# Patient Record
Sex: Male | Born: 1985 | Race: Black or African American | Hispanic: No | Marital: Single | Smoking: Never smoker
Health system: Southern US, Community
[De-identification: ages and names within clinical notes are randomized; demographics above are authoritative.]

## PROBLEM LIST (undated history)

## (undated) DIAGNOSIS — E119 Type 2 diabetes mellitus without complications: Secondary | ICD-10-CM

## (undated) DIAGNOSIS — K219 Gastro-esophageal reflux disease without esophagitis: Secondary | ICD-10-CM

## (undated) DIAGNOSIS — I10 Essential (primary) hypertension: Secondary | ICD-10-CM

## (undated) HISTORY — DX: Essential (primary) hypertension: I10

## (undated) HISTORY — DX: Type 2 diabetes mellitus without complications: E11.9

---

## 2013-07-23 ENCOUNTER — Encounter: Payer: Self-pay | Admitting: Sports Medicine

## 2013-07-23 ENCOUNTER — Ambulatory Visit (INDEPENDENT_AMBULATORY_CARE_PROVIDER_SITE_OTHER): Payer: BC Managed Care – PPO | Admitting: Sports Medicine

## 2013-07-23 VITALS — BP 182/94 | HR 82 | Wt 305.0 lb

## 2013-07-23 DIAGNOSIS — Z0000 Encounter for general adult medical examination without abnormal findings: Secondary | ICD-10-CM | POA: Insufficient documentation

## 2013-07-23 DIAGNOSIS — E669 Obesity, unspecified: Secondary | ICD-10-CM

## 2013-07-23 DIAGNOSIS — I10 Essential (primary) hypertension: Secondary | ICD-10-CM

## 2013-07-23 DIAGNOSIS — E119 Type 2 diabetes mellitus without complications: Secondary | ICD-10-CM

## 2013-07-23 DIAGNOSIS — Z299 Encounter for prophylactic measures, unspecified: Secondary | ICD-10-CM

## 2013-07-23 LAB — COMPREHENSIVE METABOLIC PANEL WITH GFR
ALT: 21 U/L (ref 0–53)
Alkaline Phosphatase: 94 U/L (ref 39–117)
Total Bilirubin: 0.5 mg/dL (ref 0.3–1.2)

## 2013-07-23 LAB — LIPID PANEL
Cholesterol: 123 mg/dL (ref 0–200)
HDL: 35 mg/dL — ABNORMAL LOW (ref >39)
LDL Cholesterol: 64 mg/dL (ref 0–99)
Total CHOL/HDL Ratio: 3.5 Ratio
Triglycerides: 121 mg/dL (ref <150)
VLDL: 24 mg/dL (ref 0–40)

## 2013-07-23 LAB — COMPREHENSIVE METABOLIC PANEL
AST: 17 U/L (ref 0–37)
Albumin: 4.5 g/dL (ref 3.5–5.2)
BUN: 12 mg/dL (ref 6–23)
CO2: 25 mEq/L (ref 19–32)
Calcium: 9.6 mg/dL (ref 8.4–10.5)
Chloride: 99 mEq/L (ref 96–112)
Creat: 1.14 mg/dL (ref 0.50–1.35)
Glucose, Bld: 107 mg/dL — ABNORMAL HIGH (ref 70–99)
Potassium: 4.1 mEq/L (ref 3.5–5.3)
Sodium: 136 mEq/L (ref 135–145)
Total Protein: 7.7 g/dL (ref 6.0–8.3)

## 2013-07-23 LAB — HEMOGLOBIN A1C
Hgb A1c MFr Bld: 6.6 % — ABNORMAL HIGH (ref <5.7)
Mean Plasma Glucose: 143 mg/dL — ABNORMAL HIGH (ref <117)

## 2013-07-23 LAB — CBC
HCT: 46.5 % (ref 39.0–52.0)
Hemoglobin: 16.2 g/dL (ref 13.0–17.0)
MCH: 29.3 pg (ref 26.0–34.0)
MCHC: 34.8 g/dL (ref 30.0–36.0)
MCV: 84.1 fL (ref 78.0–100.0)
Platelets: 282 K/uL (ref 150–400)
RBC: 5.53 MIL/uL (ref 4.22–5.81)
RDW: 14.1 % (ref 11.5–15.5)
WBC: 7.6 10*3/uL (ref 4.0–10.5)

## 2013-07-23 LAB — TSH: TSH: 1.747 u[IU]/mL (ref 0.350–4.500)

## 2013-07-23 MED ORDER — LISINOPRIL-HYDROCHLOROTHIAZIDE 20-25 MG PO TABS: 1.0000 | ORAL_TABLET | Freq: Every day | ORAL | Status: DC

## 2013-07-23 MED ORDER — PHENTERMINE HCL 37.5 MG PO CAPS: 37.5000 mg | ORAL_CAPSULE | ORAL | Status: DC

## 2013-07-23 NOTE — Assessment & Plan Note (Signed)
Up-to-date on all preventative measures, has had flu shot and tetanus shot earlier this year.

## 2013-07-23 NOTE — Assessment & Plan Note (Signed)
Start lisinopril/hydrochlorothiazide. Routine blood work. We will also work on weight loss, and risk factor modification.

## 2013-07-23 NOTE — Progress Notes (Signed)
  Subjective:    CC: Establish care.   HPI:  This is a very pleasant 27 year old male, he has a history of elevated blood pressure, and obesity. He comes to establish care, and manage his risk factors.  Hypertension: No headaches, visual changes, chest pain, has been on medication in the past he is unable to remember the name of.  Obesity: Has tried dieting, exercise, eager to try weight loss medication.  Preventative measures: Up-to-date on flu and tetanus vaccinations.  Past medical history, Surgical history, Family history not pertinant except as noted below, Social history, Allergies, and medications have been entered into the medical record, reviewed, and no changes needed.   Review of Systems: No headache, visual changes, nausea, vomiting, diarrhea, constipation, dizziness, abdominal pain, skin rash, fevers, chills, night sweats, swollen lymph nodes, weight loss, chest pain, body aches, joint swelling, muscle aches, shortness of breath, mood changes, visual or auditory hallucinations.  Objective:    General: Well Developed, well nourished, and in no acute distress.  Neuro: Alert and oriented x3, extra-ocular muscles intact, sensation grossly intact.  HEENT: Normocephalic, atraumatic, pupils equal round reactive to light, neck supple, no masses, no lymphadenopathy, thyroid nonpalpable.  Skin: Warm and dry, no rashes noted.  Cardiac: Regular rate and rhythm, no murmurs rubs or gallops.  Respiratory: Clear to auscultation bilaterally. Not using accessory muscles, speaking in full sentences.  Abdominal: Soft, nontender, nondistended, positive bowel sounds, no masses, no organomegaly.  Musculoskeletal: Shoulder, elbow, wrist, hip, knee, ankle stable, and with full range of motion. Impression and Recommendations:    The patient was counselled, risk factors were discussed, anticipatory guidance given.

## 2013-07-23 NOTE — Assessment & Plan Note (Signed)
Phentermine, return monthly for weight checks and refills. Referral to nutrition.

## 2013-07-24 DIAGNOSIS — E119 Type 2 diabetes mellitus without complications: Secondary | ICD-10-CM | POA: Insufficient documentation

## 2013-07-24 DIAGNOSIS — E1169 Type 2 diabetes mellitus with other specified complication: Secondary | ICD-10-CM | POA: Insufficient documentation

## 2013-08-28 ENCOUNTER — Ambulatory Visit: Payer: BC Managed Care – PPO | Admitting: Sports Medicine

## 2013-09-04 ENCOUNTER — Ambulatory Visit: Payer: BC Managed Care – PPO | Admitting: Sports Medicine

## 2013-09-04 DIAGNOSIS — Z0289 Encounter for other administrative examinations: Secondary | ICD-10-CM

## 2013-10-23 ENCOUNTER — Ambulatory Visit: Payer: BC Managed Care – PPO | Admitting: Sports Medicine

## 2013-10-23 DIAGNOSIS — Z0289 Encounter for other administrative examinations: Secondary | ICD-10-CM

## 2014-02-16 ENCOUNTER — Telehealth: Payer: Self-pay

## 2014-02-16 NOTE — Telephone Encounter (Signed)
Left message for patient to call back to schedule a follow up for HTN.

## 2014-02-25 ENCOUNTER — Emergency Department (INDEPENDENT_AMBULATORY_CARE_PROVIDER_SITE_OTHER)
Admission: EM | Admit: 2014-02-25 | Discharge: 2014-02-25 | Disposition: A | Discharge status: Home / Self Care | Payer: Worker's Compensation | Source: Home / Self Care | Attending: Emergency Medicine | Admitting: Emergency Medicine

## 2014-02-25 ENCOUNTER — Encounter: Payer: Self-pay | Admitting: Emergency Medicine

## 2014-02-25 ENCOUNTER — Emergency Department (INDEPENDENT_AMBULATORY_CARE_PROVIDER_SITE_OTHER): Payer: Worker's Compensation

## 2014-02-25 DIAGNOSIS — I10 Essential (primary) hypertension: Secondary | ICD-10-CM

## 2014-02-25 DIAGNOSIS — S335XXA Sprain of ligaments of lumbar spine, initial encounter: Secondary | ICD-10-CM

## 2014-02-25 DIAGNOSIS — S39012A Strain of muscle, fascia and tendon of lower back, initial encounter: Secondary | ICD-10-CM

## 2014-02-25 DIAGNOSIS — M549 Dorsalgia, unspecified: Secondary | ICD-10-CM

## 2014-02-25 MED ORDER — CYCLOBENZAPRINE HCL 10 MG PO TABS: ORAL_TABLET | ORAL | Status: DC

## 2014-02-25 MED ORDER — LISINOPRIL-HYDROCHLOROTHIAZIDE 20-25 MG PO TABS: 1.0000 | ORAL_TABLET | Freq: Every day | ORAL | Status: DC

## 2014-02-25 MED ORDER — HYDROCODONE-ACETAMINOPHEN 5-325 MG PO TABS: 1.0000 | ORAL_TABLET | ORAL | Status: DC | PRN

## 2014-02-25 MED ORDER — IBUPROFEN 600 MG PO TABS: 600.0000 mg | ORAL_TABLET | Freq: Three times a day (TID) | ORAL | Status: DC | PRN

## 2014-02-25 NOTE — ED Notes (Signed)
Reports twisting at work to catch falling cart and pulling muscles along mid and lower right side of back.

## 2014-02-25 NOTE — ED Provider Notes (Signed)
CSN: [id number]     Arrival date & time 02/25/14  1724 History   First MD Initiated Contact with Patient 02/25/14 1726     Chief Complaint  Patient presents with  . Back Pain   Reports twisting at work to catch falling cart and pulling muscles along mid and lower right side of back.  At 9 am today.   Patient is a 28 y.o. male presenting with back pain. The history is provided by the patient.  Back Pain Location:  Lumbar spine (Right lumbosacral) Quality: Sharp. Radiates to:  Does not radiate Pain severity:  Severe (9/10) Onset quality:  Sudden Duration:  9 hours Timing:  Constant Progression:  Worsening Chronicity:  New Context: lifting heavy objects, occupational injury and twisting   Relieved by:  Nothing  Denies prior back problems.  Associated symptoms: No radiation, no leg pain. No incontinence or urinary symptoms.  Past Medical History  Diagnosis Date  . Hypertension    History reviewed. No pertinent past surgical history. Family History  Problem Relation Age of Onset  . Hyperlipidemia Mother   . Hypertension Mother    History  Substance Use Topics  . Smoking status: Never Smoker   . Smokeless tobacco: Not on file  . Alcohol Use: No    Review of Systems  Musculoskeletal: Positive for back pain.  All other systems reviewed and are negative.   Allergies  Review of patient's allergies indicates no known allergies.  Home Medications   Prior to Admission medications   Medication Sig Start Date End Date Taking? Authorizing Provider  cyclobenzaprine (FLEXERIL) 10 MG tablet One-Half tablet every 8 hours as needed for muscle relaxant. May take a whole tablet at bedtime.-- Caution: May cause drowsiness. 02/25/14   Lajean Manesavid Massey, MD  HYDROcodone-acetaminophen (NORCO/VICODIN) 5-325 MG per tablet Take 1-2 tablets by mouth every 4 (four) hours as needed for severe pain. Take with food. 02/25/14   Lajean Manesavid Massey, MD  ibuprofen (ADVIL,MOTRIN) 600 MG tablet Take 1 tablet  (600 mg total) by mouth every 8 (eight) hours as needed for mild pain or moderate pain. Take with food. 02/25/14   Lajean Manesavid Massey, MD  lisinopril-hydrochlorothiazide (PRINZIDE,ZESTORETIC) 20-25 MG per tablet Take 1 tablet by mouth daily. 07/23/13   Monica Bectonhomas J Thekkekandam, MD  lisinopril-hydrochlorothiazide (PRINZIDE,ZESTORETIC) 20-25 MG per tablet Take 1 tablet by mouth daily. 02/25/14   Lajean Manesavid Massey, MD   BP 164/97  Pulse 76  Temp(Src) 98.3 F (36.8 C) (Oral)  Resp 16  Ht 5\' 6"  (1.676 m)  Wt 294 lb (133.358 kg)  BMI 47.48 kg/m2  SpO2 97% Physical Exam  Nursing note and vitals reviewed. Constitutional: He is oriented to person, place, and time. He appears well-developed and well-nourished. He is cooperative.  Non-toxic appearance. He appears distressed (Appears uncomfortable from low back pain.).  HENT:  Head: Normocephalic and atraumatic.  Mouth/Throat: Oropharynx is clear and moist.  Eyes: EOM are normal. Pupils are equal, round, and reactive to light. No scleral icterus.  Neck: Neck supple.  Cardiovascular: Regular rhythm and normal heart sounds.   Pulmonary/Chest: Effort normal and breath sounds normal. No respiratory distress. He has no wheezes. He has no rales. He exhibits no tenderness.  Abdominal: Soft. There is no tenderness.  Musculoskeletal:       Right hip: Normal.       Left hip: Normal.       Cervical back: He exhibits no tenderness.       Thoracic back: He exhibits no tenderness.  Lumbar back: He exhibits decreased range of motion, tenderness, bony tenderness and spasm. He exhibits no swelling, no edema, no deformity, no laceration and normal pulse.  Negative Right straight leg-raise test. Negative Left straight leg-raise test.  Negative Right Luisa Hart test. Negative Left Luisa Hart test.  Lumbar area: Very tender LS-spine and right paralumbar muscles with muscle spasm. Pain exacerbated by torsion flexion and extension.    Neurological: He is alert and oriented to  person, place, and time. He has normal strength. He displays no atrophy, no tremor and normal reflexes. No cranial nerve deficit or sensory deficit. He exhibits normal muscle tone. Gait normal.  Reflex Scores:      Patellar reflexes are 2+ on the right side and 2+ on the left side.      Achilles reflexes are 2+ on the right side and 2+ on the left side. Skin: Skin is warm, dry and intact. No lesion and no rash noted.  Psychiatric: He has a normal mood and affect.    ED Course  Procedures (including critical care time) Labs Review Labs Reviewed - No data to display  Imaging Review Dg Lumbar Spine Complete  02/25/2014   CLINICAL DATA:  Back pain after lifting injury today.  EXAM: LUMBAR SPINE - COMPLETE 4+ VIEW  COMPARISON:  None.  FINDINGS: There are 5 lumbar type vertebral bodies. The alignment is normal. The disc spaces are preserved. There is no evidence of fracture, pars defect or significant facet disease.  IMPRESSION: Negative lumbar spine radiographs.   Electronically Signed   By: Roxy Horseman M.D.   On: 02/25/2014 19:00     MDM   1. Lumbar strain, initial encounter    reviewed with patient that x-ray LS-spine shows no fracture, no acute abnormalities.  Discussed treatment with patient. Ice x24 hours, then heat. Ibuprofen 600 mg every 8 hours with food when necessary mild to moderate pain. Vicodin 5/325. #12 No refills. One or 2 by mouth every 4-6 hours when necessary severe pain, but precautions discussed. Flexeril 10 mg. One half 3 times a day when necessary muscle relaxant but drowsiness precautions discussed.  I completed the "[employer] medical report" form supplied by his employer. Advised no lifting from 7/23-7/28/15. He may return to restricted duty on 02/26/2014. He may work, but absolutely no lifting, bending, pushing/pulling, operating machinery or motor vehicle from 7/23-7/28/15. Please see the [employer] medical report that I completed for other details . Advise followup  appointment at employer health services for recheck on Tuesday 03/02/2014.  Precautions discussed. Red flags discussed. Questions invited and answered. Patient voiced understanding and agreement.   Addendum. He has hypertension, BP today 164/97. He ran out of his BP meds.--- I refilled 30 days of his lisinopril/HCTZ BP med, and urged him to followup with his PCP within 30 days and explained the importance of this and risks of not following up.--He voiced understanding and agreement.  Lajean Manes, MD 02/25/14 2051

## 2014-05-12 ENCOUNTER — Ambulatory Visit (INDEPENDENT_AMBULATORY_CARE_PROVIDER_SITE_OTHER): Payer: Self-pay | Admitting: Sports Medicine

## 2014-05-12 ENCOUNTER — Encounter: Payer: Self-pay | Admitting: Sports Medicine

## 2014-05-12 VITALS — BP 187/83 | HR 82 | Ht 65.0 in | Wt 310.0 lb

## 2014-05-12 DIAGNOSIS — B351 Tinea unguium: Secondary | ICD-10-CM

## 2014-05-12 DIAGNOSIS — I10 Essential (primary) hypertension: Secondary | ICD-10-CM

## 2014-05-12 MED ORDER — OLMESARTAN-AMLODIPINE-HCTZ 40-10-25 MG PO TABS: 1.0000 | ORAL_TABLET | Freq: Every day | ORAL | Status: DC

## 2014-05-12 MED ORDER — TERBINAFINE HCL 250 MG PO TABS: 250.0000 mg | ORAL_TABLET | Freq: Every day | ORAL | Status: DC

## 2014-05-12 NOTE — Assessment & Plan Note (Signed)
Lamisil for 3-6 months. 

## 2014-05-12 NOTE — Assessment & Plan Note (Signed)
Insufficient control with lisinopril/hydrochlorothiazide. Switching to Tribenzor. Return in 2 weeks.

## 2014-05-12 NOTE — Progress Notes (Signed)
  Subjective:    CC: Followup  HPI: Hypertension: Richard Mooney has been somewhat delinquent, I haven't seen him for months, blood pressure is very elevated. No headaches, visual changes, chest pain, shortness of breath. He went to urgent care and his medications refilled.  Diabetes mellitus type 2: Essentially diet-controlled.   Toenail fungus: Would like this treated.  Past medical history, Surgical history, Family history not pertinant except as noted below, Social history, Allergies, and medications have been entered into the medical record, reviewed, and no changes needed.   Review of Systems: No fevers, chills, night sweats, weight loss, chest pain, or shortness of breath.   Objective:    General: Well Developed, well nourished, and in no acute distress.  Neuro: Alert and oriented x3, extra-ocular muscles intact, sensation grossly intact.  HEENT: Normocephalic, atraumatic, pupils equal round reactive to light, neck supple, no masses, no lymphadenopathy, thyroid nonpalpable.  Skin: Warm and dry, no rashes. Cardiac: Regular rate and rhythm, no murmurs rubs or gallops, 1+ lower extremity edema.  Respiratory: Clear to auscultation bilaterally. Not using accessory muscles, speaking in full sentences.  Diabetic Foot Exam Both feet were examined, there are no signs of ulceration or abnormal callus. Nails are unremarkable. Dorsalis pedis and posterior tibial pulses are palpable. Sensation is intact to sharp and monofilament. Shoes are of appropriate fitment. Significant onychomycosis with onychodystrophy on all toenails.   scaling over the feet.  Impression and Recommendations:

## 2014-05-18 ENCOUNTER — Telehealth: Payer: Self-pay

## 2014-05-18 MED ORDER — AZILSARTAN-CHLORTHALIDONE 40-25 MG PO TABS: 1.0000 | ORAL_TABLET | Freq: Every day | ORAL | Status: DC

## 2014-05-18 NOTE — Telephone Encounter (Signed)
Lets try Edarbyclor instead.  Have him come get a discount card. I will send in Rx.

## 2014-05-18 NOTE — Telephone Encounter (Signed)
Spoke to patient gave him information noted below. Patient stated that he will come in tomorrow and pick up discount card. Freddye Cardamone,CMA

## 2014-05-18 NOTE — Telephone Encounter (Signed)
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 Patient stated that until he is told other wise he will continue to take his old blood pressure medication ,diet exercise and watch what he eats til his next appt. Rhonda Cunningham,CMA

## 2014-05-26 ENCOUNTER — Encounter: Payer: Self-pay | Admitting: Sports Medicine

## 2014-05-26 ENCOUNTER — Ambulatory Visit (INDEPENDENT_AMBULATORY_CARE_PROVIDER_SITE_OTHER): Payer: Self-pay | Admitting: Sports Medicine

## 2014-05-26 VITALS — BP 192/92 | HR 85 | Ht 66.0 in | Wt 312.0 lb

## 2014-05-26 DIAGNOSIS — B351 Tinea unguium: Secondary | ICD-10-CM

## 2014-05-26 DIAGNOSIS — I10 Essential (primary) hypertension: Secondary | ICD-10-CM

## 2014-05-26 MED ORDER — AMLODIPINE BESYLATE 10 MG PO TABS: 10.0000 mg | ORAL_TABLET | Freq: Every day | ORAL | Status: DC

## 2014-05-26 MED ORDER — LISINOPRIL-HYDROCHLOROTHIAZIDE 20-25 MG PO TABS: 1.0000 | ORAL_TABLET | Freq: Every day | ORAL | Status: DC

## 2014-05-26 MED ORDER — TERBINAFINE HCL 250 MG PO TABS: 250.0000 mg | ORAL_TABLET | Freq: Every day | ORAL | Status: AC

## 2014-05-26 NOTE — Assessment & Plan Note (Signed)
Persistently elevated, Richard Mooney is too expensive, $115 a month with the discount card. Switching back to a generic, lisinopril/hydrochlorothiazide plus high dose amlodipine.

## 2014-05-26 NOTE — Assessment & Plan Note (Signed)
Switching Lamisil to a different pharmacy.

## 2014-05-26 NOTE — Progress Notes (Signed)
  Subjective:    CC: Followup  HPI: Hypertension: Persistently elevated, has not been able to afford to blood pressure medications we have called in. We did initially have him on lisinopril/hydrochlorothiazide which was insufficient in control. He has no insurance, and tells me he is going to apply for Medicaid. We are going to try and pick generic only medications no headaches no visual changes, chest pain, speech changes, or numbness or tingling on either side of the body.  Past medical history, Surgical history, Family history not pertinant except as noted below, Social history, Allergies, and medications have been entered into the medical record, reviewed, and no changes needed.   Review of Systems: No fevers, chills, night sweats, weight loss, chest pain, or shortness of breath.   Objective:    General: Well Developed, well nourished, and in no acute distress.  Neuro: Alert and oriented x3, extra-ocular muscles intact, sensation grossly intact.  HEENT: Normocephalic, atraumatic, pupils equal round reactive to light, neck supple, no masses, no lymphadenopathy, thyroid nonpalpable.  Skin: Warm and dry, no rashes. Cardiac: Regular rate and rhythm, no murmurs rubs or gallops, no lower extremity edema.  Respiratory: Clear to auscultation bilaterally. Not using accessory muscles, speaking in full sentences.  Impression and Recommendations:

## 2014-06-09 ENCOUNTER — Ambulatory Visit: Payer: Self-pay | Admitting: Sports Medicine

## 2014-06-11 ENCOUNTER — Encounter: Payer: Self-pay | Admitting: Sports Medicine

## 2014-06-11 ENCOUNTER — Ambulatory Visit (INDEPENDENT_AMBULATORY_CARE_PROVIDER_SITE_OTHER): Payer: Self-pay | Admitting: Sports Medicine

## 2014-06-11 VITALS — BP 148/81 | HR 70 | Ht 66.0 in | Wt 309.0 lb

## 2014-06-11 DIAGNOSIS — I10 Essential (primary) hypertension: Secondary | ICD-10-CM

## 2014-06-11 MED ORDER — CARVEDILOL 12.5 MG PO TABS: ORAL_TABLET | ORAL | Status: DC

## 2014-06-11 NOTE — Assessment & Plan Note (Signed)
Blood pressure has improved significantly on lisinopril/hydrochlorothiazide and amlodipine. Adding carvedilol.  Return in 2 weeks.

## 2014-06-11 NOTE — Progress Notes (Signed)
  Subjective:    CC: follow-up  HPI: Hypertension: Improved significantly on amlodipine 10, and lisinopril/hydrochlorothiazide at maximum doses. Feeling much better, no tingling in the legs, no headaches.  Past medical history, Surgical history, Family history not pertinant except as noted below, Social history, Allergies, and medications have been entered into the medical record, reviewed, and no changes needed.   Review of Systems: No fevers, chills, night sweats, weight loss, chest pain, or shortness of breath.   Objective:    General: Well Developed, well nourished, and in no acute distress.  Neuro: Alert and oriented x3, extra-ocular muscles intact, sensation grossly intact.  HEENT: Normocephalic, atraumatic, pupils equal round reactive to light, neck supple, no masses, no lymphadenopathy, thyroid nonpalpable.  Skin: Warm and dry, no rashes. Cardiac: Regular rate and rhythm, no murmurs rubs or gallops, no lower extremity edema.  Respiratory: Clear to auscultation bilaterally. Not using accessory muscles, speaking in full sentences.  Impression and Recommendations:

## 2014-06-24 ENCOUNTER — Ambulatory Visit: Payer: Self-pay | Admitting: Sports Medicine

## 2014-07-06 ENCOUNTER — Ambulatory Visit: Payer: Self-pay | Admitting: Sports Medicine

## 2014-07-12 ENCOUNTER — Ambulatory Visit: Payer: Self-pay | Admitting: Sports Medicine

## 2014-08-05 ENCOUNTER — Ambulatory Visit: Payer: Self-pay | Admitting: Sports Medicine

## 2014-08-13 ENCOUNTER — Ambulatory Visit: Payer: Self-pay | Admitting: Sports Medicine

## 2014-12-20 ENCOUNTER — Ambulatory Visit: Payer: Self-pay | Admitting: Sports Medicine

## 2014-12-30 ENCOUNTER — Ambulatory Visit: Payer: Self-pay | Admitting: Sports Medicine

## 2015-10-14 ENCOUNTER — Ambulatory Visit: Payer: Self-pay | Admitting: Sports Medicine

## 2015-12-23 ENCOUNTER — Ambulatory Visit: Payer: Self-pay | Admitting: Sports Medicine

## 2016-01-10 ENCOUNTER — Ambulatory Visit (INDEPENDENT_AMBULATORY_CARE_PROVIDER_SITE_OTHER): Payer: BLUE CROSS/BLUE SHIELD | Admitting: Sports Medicine

## 2016-01-10 ENCOUNTER — Encounter: Payer: Self-pay | Admitting: Sports Medicine

## 2016-01-10 VITALS — BP 200/118 | HR 73 | Resp 18 | Wt 333.8 lb

## 2016-01-10 DIAGNOSIS — E119 Type 2 diabetes mellitus without complications: Secondary | ICD-10-CM

## 2016-01-10 DIAGNOSIS — I10 Essential (primary) hypertension: Secondary | ICD-10-CM

## 2016-01-10 DIAGNOSIS — Z23 Encounter for immunization: Secondary | ICD-10-CM

## 2016-01-10 DIAGNOSIS — G4719 Other hypersomnia: Secondary | ICD-10-CM | POA: Diagnosis not present

## 2016-01-10 DIAGNOSIS — G4733 Obstructive sleep apnea (adult) (pediatric): Secondary | ICD-10-CM | POA: Insufficient documentation

## 2016-01-10 DIAGNOSIS — Z299 Encounter for prophylactic measures, unspecified: Secondary | ICD-10-CM

## 2016-01-10 DIAGNOSIS — E669 Obesity, unspecified: Secondary | ICD-10-CM | POA: Diagnosis not present

## 2016-01-10 LAB — LIPID PANEL
Cholesterol: 157 mg/dL (ref 125–200)
HDL: 33 mg/dL — ABNORMAL LOW (ref >=40)
LDL Cholesterol: 86 mg/dL (ref <130)
Total CHOL/HDL Ratio: 4.8 Ratio (ref <=5.0)
Triglycerides: 192 mg/dL — ABNORMAL HIGH (ref <150)
VLDL: 38 mg/dL — ABNORMAL HIGH (ref <30)

## 2016-01-10 LAB — CBC
HCT: 40.6 % (ref 38.5–50.0)
Hemoglobin: 13.6 g/dL (ref 13.2–17.1)
MCH: 28.2 pg (ref 27.0–33.0)
MCHC: 33.5 g/dL (ref 32.0–36.0)
MCV: 84.1 fL (ref 80.0–100.0)
MPV: 10.7 fL (ref 7.5–12.5)
Platelets: 280 K/uL (ref 140–400)
RBC: 4.83 MIL/uL (ref 4.20–5.80)
RDW: 14.3 % (ref 11.0–15.0)
WBC: 8.7 K/uL (ref 3.8–10.8)

## 2016-01-10 LAB — COMPREHENSIVE METABOLIC PANEL
Albumin: 3.8 g/dL (ref 3.6–5.1)
CO2: 25 mmol/L (ref 20–31)
Calcium: 9.1 mg/dL (ref 8.6–10.3)
Chloride: 103 mmol/L (ref 98–110)
Glucose, Bld: 107 mg/dL — ABNORMAL HIGH (ref 65–99)
Potassium: 4.3 mmol/L (ref 3.5–5.3)
Sodium: 136 mmol/L (ref 135–146)
Total Protein: 6.9 g/dL (ref 6.1–8.1)

## 2016-01-10 LAB — COMPREHENSIVE METABOLIC PANEL WITH GFR
ALT: 16 U/L (ref 9–46)
AST: 12 U/L (ref 10–40)
Alkaline Phosphatase: 92 U/L (ref 40–115)
BUN: 11 mg/dL (ref 7–25)
Creat: 1.08 mg/dL (ref 0.60–1.35)
Total Bilirubin: 0.3 mg/dL (ref 0.2–1.2)

## 2016-01-10 LAB — HEMOGLOBIN A1C
Hgb A1c MFr Bld: 6.8 % — ABNORMAL HIGH (ref <5.7)
Mean Plasma Glucose: 148 mg/dL

## 2016-01-10 LAB — TSH: TSH: 2.62 mIU/L (ref 0.40–4.50)

## 2016-01-10 MED ORDER — AMLODIPINE BESYLATE 10 MG PO TABS: 10.0000 mg | ORAL_TABLET | Freq: Every day | ORAL | Status: DC

## 2016-01-10 MED ORDER — LISINOPRIL-HYDROCHLOROTHIAZIDE 20-25 MG PO TABS: 1.0000 | ORAL_TABLET | Freq: Every day | ORAL | Status: DC

## 2016-01-10 MED ORDER — CARVEDILOL 12.5 MG PO TABS: ORAL_TABLET | ORAL | Status: DC

## 2016-01-10 NOTE — Assessment & Plan Note (Signed)
Adding split-night sleep study.

## 2016-01-10 NOTE — Assessment & Plan Note (Signed)
We will start weight loss treatment, likely phentermine and/or Contrave once blood pressure is controlled.

## 2016-01-10 NOTE — Assessment & Plan Note (Signed)
Recheck hemoglobin A1c 

## 2016-01-10 NOTE — Assessment & Plan Note (Signed)
Restarting all medications.  Return in one week.

## 2016-01-10 NOTE — Progress Notes (Signed)
  Subjective:    CC: Elevated blood pressure  HPI: This is a pleasant 30 year old male, we've been treating him for a number of medical problems, he was lost to follow-up back in 2015. Returns today with blood pressure in the 200s, severe leg swelling. Also notes excessive daytime sleepiness, and he has a very large neck. He would like to get back on track.  Past medical history, Surgical history, Family history not pertinant except as noted below, Social history, Allergies, and medications have been entered into the medical record, reviewed, and no changes needed.   Review of Systems: No fevers, chills, night sweats, weight loss, chest pain, or shortness of breath.   Objective:    General: Well Developed, well nourished, and in no acute distress.  Neuro: Alert and oriented x3, extra-ocular muscles intact, sensation grossly intact.  HEENT: Normocephalic, atraumatic, pupils equal round reactive to light, neck supple, no masses, no lymphadenopathy, thyroid nonpalpable.  Skin: Warm and dry, no rashes. Cardiac: Regular rate and rhythm, no murmurs rubs or gallops, 2+ pitting edema in the lower extremities Respiratory: Clear to auscultation bilaterally. Not using accessory muscles, speaking in full sentences.  Impression and Recommendations:    I spent 25 minutes with this patient, greater than 50% was face-to-face time counseling regarding the above diagnoses

## 2016-01-11 LAB — HIV ANTIBODY (ROUTINE TESTING W REFLEX): HIV 1&2 Ab, 4th Generation: NONREACTIVE

## 2016-01-17 ENCOUNTER — Ambulatory Visit: Payer: Self-pay | Admitting: Sports Medicine

## 2016-01-24 ENCOUNTER — Encounter: Payer: Self-pay | Admitting: Sports Medicine

## 2016-01-24 ENCOUNTER — Ambulatory Visit (INDEPENDENT_AMBULATORY_CARE_PROVIDER_SITE_OTHER): Payer: BLUE CROSS/BLUE SHIELD | Admitting: Sports Medicine

## 2016-01-24 VITALS — BP 158/89 | HR 76 | Wt 325.0 lb

## 2016-01-24 DIAGNOSIS — E119 Type 2 diabetes mellitus without complications: Secondary | ICD-10-CM | POA: Diagnosis not present

## 2016-01-24 DIAGNOSIS — I10 Essential (primary) hypertension: Secondary | ICD-10-CM | POA: Diagnosis not present

## 2016-01-24 MED ORDER — CARVEDILOL 25 MG PO TABS: 25.0000 mg | ORAL_TABLET | Freq: Two times a day (BID) | ORAL | Status: DC

## 2016-01-24 MED ORDER — DAPAGLIFLOZIN PROPANEDIOL 10 MG PO TABS: 10.0000 mg | ORAL_TABLET | Freq: Every day | ORAL | Status: DC

## 2016-01-24 NOTE — Progress Notes (Signed)
  Subjective:    CC: Follow-up  HPI: Hypertension: Improved significantly, feels much better, no orthostasis. Swelling is improving. No headaches, visual changes, chest pain.  Past medical history, Surgical history, Family history not pertinant except as noted below, Social history, Allergies, and medications have been entered into the medical record, reviewed, and no changes needed.   Review of Systems: No fevers, chills, night sweats, weight loss, chest pain, or shortness of breath.   Objective:    General: Well Developed, well nourished, and in no acute distress.  Neuro: Alert and oriented x3, extra-ocular muscles intact, sensation grossly intact.  HEENT: Normocephalic, atraumatic, pupils equal round reactive to light, neck supple, no masses, no lymphadenopathy, thyroid nonpalpable.  Skin: Warm and dry, no rashes. Cardiac: Regular rate and rhythm, no murmurs rubs or gallops, no lower extremity edema.  Respiratory: Clear to auscultation bilaterally. Not using accessory muscles, speaking in full sentences.  Impression and Recommendations:    I spent 25 minutes with this patient, greater than 50% was face-to-face time counseling regarding the above diagnoses

## 2016-01-24 NOTE — Assessment & Plan Note (Signed)
Still elevated, increasing carvedilol to 25 mg twice a day. Return in 2 weeks.

## 2016-01-24 NOTE — Assessment & Plan Note (Signed)
Still with some edema and elevated blood pressure, A1c was 6.8. Adding [place]Farxiga. We will get some diuresis with this as well.

## 2016-02-08 ENCOUNTER — Ambulatory Visit: Payer: Self-pay | Admitting: Sports Medicine

## 2016-02-15 ENCOUNTER — Ambulatory Visit: Payer: Self-pay | Admitting: Sports Medicine

## 2016-02-29 ENCOUNTER — Encounter: Payer: Self-pay | Admitting: Sports Medicine

## 2016-02-29 ENCOUNTER — Other Ambulatory Visit: Payer: Self-pay

## 2016-02-29 DIAGNOSIS — I10 Essential (primary) hypertension: Secondary | ICD-10-CM

## 2016-02-29 DIAGNOSIS — E119 Type 2 diabetes mellitus without complications: Secondary | ICD-10-CM

## 2016-02-29 MED ORDER — AMLODIPINE BESYLATE 10 MG PO TABS: 10.0000 mg | ORAL_TABLET | Freq: Every day | ORAL | 3 refills | Status: DC

## 2016-02-29 MED ORDER — LISINOPRIL-HYDROCHLOROTHIAZIDE 20-25 MG PO TABS: 1.0000 | ORAL_TABLET | Freq: Every day | ORAL | 3 refills | Status: DC

## 2016-02-29 MED ORDER — DAPAGLIFLOZIN PROPANEDIOL 10 MG PO TABS: 10.0000 mg | ORAL_TABLET | Freq: Every day | ORAL | 11 refills | Status: DC

## 2016-03-07 ENCOUNTER — Encounter (HOSPITAL_BASED_OUTPATIENT_CLINIC_OR_DEPARTMENT_OTHER): Payer: Self-pay

## 2016-04-17 ENCOUNTER — Encounter (HOSPITAL_BASED_OUTPATIENT_CLINIC_OR_DEPARTMENT_OTHER): Payer: Self-pay

## 2016-05-15 ENCOUNTER — Ambulatory Visit: Payer: Self-pay | Admitting: Sports Medicine

## 2016-05-29 ENCOUNTER — Ambulatory Visit (HOSPITAL_BASED_OUTPATIENT_CLINIC_OR_DEPARTMENT_OTHER): Payer: BLUE CROSS/BLUE SHIELD | Attending: Sports Medicine | Admitting: Internal Medicine

## 2016-05-29 VITALS — Ht 66.0 in | Wt 324.0 lb

## 2016-05-29 DIAGNOSIS — E119 Type 2 diabetes mellitus without complications: Secondary | ICD-10-CM | POA: Insufficient documentation

## 2016-05-29 DIAGNOSIS — Z6841 Body Mass Index (BMI) 40.0 and over, adult: Secondary | ICD-10-CM | POA: Diagnosis not present

## 2016-05-29 DIAGNOSIS — G4733 Obstructive sleep apnea (adult) (pediatric): Secondary | ICD-10-CM | POA: Diagnosis not present

## 2016-05-29 DIAGNOSIS — R0683 Snoring: Secondary | ICD-10-CM | POA: Diagnosis not present

## 2016-05-29 DIAGNOSIS — I10 Essential (primary) hypertension: Secondary | ICD-10-CM | POA: Diagnosis not present

## 2016-05-29 DIAGNOSIS — R5383 Other fatigue: Secondary | ICD-10-CM | POA: Insufficient documentation

## 2016-05-29 DIAGNOSIS — G4719 Other hypersomnia: Secondary | ICD-10-CM | POA: Diagnosis not present

## 2016-05-29 DIAGNOSIS — E669 Obesity, unspecified: Secondary | ICD-10-CM | POA: Insufficient documentation

## 2016-06-03 DIAGNOSIS — G4719 Other hypersomnia: Secondary | ICD-10-CM | POA: Diagnosis not present

## 2016-06-03 NOTE — Procedures (Signed)
Patient Name: Richard Mooney, Richard Mooney Date: 05/29/2016 Gender: Male D.O.B: 07-27-1986 Age (years): 30 Referring Provider: Gwen Her Thekkekandam Height (inches): 52 Interpreting Physician: Baird Lyons MD, ABSM Weight (lbs): 324 RPSGT: Laren Everts BMI: 52 MRN: [id number] Neck Size: 17.50 CLINICAL INFORMATION Sleep Study Type: Split Night CPAP Indication for sleep study: Diabetes, Excessive Daytime Sleepiness, Fatigue, Hypertension, Obesity, Snoring, Witnessed Apneas Epworth Sleepiness Score: 11  SLEEP STUDY TECHNIQUE As per the AASM Manual for the Scoring of Sleep and Associated Events v2.3 (April 2016) with a hypopnea requiring 4% desaturations. The channels recorded and monitored were frontal, central and occipital EEG, electrooculogram (EOG), submentalis EMG (chin), nasal and oral airflow, thoracic and abdominal wall motion, anterior tibialis EMG, snore microphone, electrocardiogram, and pulse oximetry. Continuous positive airway pressure (CPAP) was initiated when the patient met split night criteria and was titrated according to treat sleep-disordered breathing.  MEDICATIONS Medications self-administered by patient taken the night of the study : none   RESPIRATORY PARAMETERS Diagnostic Total AHI (/hr): 87.9 RDI (/hr): 104.2 OA Index (/hr): 11.2 CA Index (/hr): 0.0 REM AHI (/hr): N/A NREM AHI (/hr): 87.9 Supine AHI (/hr): 89.2 Non-supine AHI (/hr): 87.67 Min O2 Sat (%): 82.00 Mean O2 (%): 93.32 Time below 88% (min): 10.7   Titration Optimal Pressure (cm): 12 AHI at Optimal Pressure (/hr): 0.5 Min O2 at Optimal Pressure (%): 89.0 Supine % at Optimal (%): 100 Sleep % at Optimal (%): 100    SLEEP ARCHITECTURE The recording time for the entire night was 382.1 minutes. During a baseline period of 173.5 minutes, the patient slept for 129.0 minutes in REM and nonREM, yielding a sleep efficiency of 74.3%. Sleep onset after lights out was 3.3 minutes with a REM latency of N/A  minutes. The patient spent 19.38% of the night in stage N1 sleep, 80.62% in stage N2 sleep, 0.00% in stage N3 and 0.00% in REM. During the titration period of 201.7 minutes, the patient slept for 194.2 minutes in REM and nonREM, yielding a sleep efficiency of 96.3%. Sleep onset after CPAP initiation was 7.4 minutes with a REM latency of 2.0 minutes. The patient spent 1.80% of the night in stage N1 sleep, 53.92% in stage N2 sleep, 0.00% in stage N3 and 44.27% in REM.  CARDIAC DATA The 2 lead EKG demonstrated sinus rhythm. The mean heart rate was 62.51 beats per minute. Other EKG findings include: None.  LEG MOVEMENT DATA The total Periodic Limb Movements of Sleep (PLMS) were 3. The PLMS index was 0.56 .  IMPRESSIONS - Severe obstructive sleep apnea occurred during the diagnostic portion of the study (AHI = 87.9/hour). An optimal PAP pressure was selected for this patient ( 12 cm of water) - No significant central sleep apnea occurred during the diagnostic portion of the study (CAI = 0.0/hour). - Severe oxygen desaturation was noted during the diagnostic portion of the study (Min O2 = 82.00%). - The patient snored with Moderate snoring volume during the diagnostic portion of the study. - No cardiac abnormalities were noted during this study. - Clinically significant periodic limb movements did not occur during sleep.  DIAGNOSIS - Obstructive Sleep Apnea (327.23 [G47.33 ICD-10])  RECOMMENDATIONS - Trial of CPAP therapy on 12 cm H2O with a Medium size Fisher&Paykel Full Face Mask Simplus mask and heated humidification. - Avoid alcohol, sedatives and other CNS depressants that may worsen sleep apnea and disrupt normal sleep architecture. - Sleep hygiene should be reviewed to assess factors that may improve sleep quality. - Weight management and  regular exercise should be initiated or continued.  [Electronically signed] 06/03/2016 12:03 PM  Baird Lyons MD, [place], American Board of  Sleep Medicine   NPI: [id number]  [place], American Board of Sleep Medicine  ELECTRONICALLY SIGNED ON:  06/03/2016, 12:01 PM [place] PH: [phone]   FX: [phone] [place]

## 2016-06-05 ENCOUNTER — Ambulatory Visit (INDEPENDENT_AMBULATORY_CARE_PROVIDER_SITE_OTHER): Payer: BLUE CROSS/BLUE SHIELD | Admitting: Sports Medicine

## 2016-06-05 ENCOUNTER — Encounter: Payer: Self-pay | Admitting: Sports Medicine

## 2016-06-05 DIAGNOSIS — G4733 Obstructive sleep apnea (adult) (pediatric): Secondary | ICD-10-CM

## 2016-06-05 DIAGNOSIS — I10 Essential (primary) hypertension: Secondary | ICD-10-CM

## 2016-06-05 DIAGNOSIS — K219 Gastro-esophageal reflux disease without esophagitis: Secondary | ICD-10-CM

## 2016-06-05 DIAGNOSIS — L711 Rhinophyma: Secondary | ICD-10-CM | POA: Insufficient documentation

## 2016-06-05 MED ORDER — ESOMEPRAZOLE MAGNESIUM 40 MG PO CPDR: 40.0000 mg | DELAYED_RELEASE_CAPSULE | Freq: Every evening | ORAL | 3 refills | Status: DC

## 2016-06-05 MED ORDER — AMBULATORY NON FORMULARY MEDICATION: 0 refills | Status: DC

## 2016-06-05 MED ORDER — AZILSARTAN-CHLORTHALIDONE 40-25 MG PO TABS: 1.0000 | ORAL_TABLET | Freq: Every day | ORAL | 11 refills | Status: DC

## 2016-06-05 MED ORDER — METRONIDAZOLE 0.75 % EX GEL: 1.0000 "application " | Freq: Two times a day (BID) | CUTANEOUS | 3 refills | Status: DC

## 2016-06-05 NOTE — Progress Notes (Signed)
  Subjective:    CC: Follow-up  HPI: Hypertension: Improved. No headaches, visual changes, chest pain  Obstructive sleep apnea: Positive sleep study with recommended CPAP of 12 cm H2O, needs a prescription for CPAP machine.  Rash on nose: Thickened, lumpy appearance of the nose on both sides. Asymptomatic.   Hoarseness: Present through the day and when waking up. Clears his throat through the day.  Past medical history:  Negative.  See flowsheet/record as well for more information.  Surgical history: Negative.  See flowsheet/record as well for more information.  Family history: Negative.  See flowsheet/record as well for more information.  Social history: Negative.  See flowsheet/record as well for more information.  Allergies, and medications have been entered into the medical record, reviewed, and no changes needed.   Review of Systems: No fevers, chills, night sweats, weight loss, chest pain, or shortness of breath.   Objective:    General: Well Developed, well nourished, and in no acute distress.  Neuro: Alert and oriented x3, extra-ocular muscles intact, sensation grossly intact.  HEENT: Normocephalic, atraumatic, pupils equal round reactive to light, neck supple, no masses, no lymphadenopathy, thyroid nonpalpable. Questionable rhinophyma on the nose.  Skin: Warm and dry, no rashes. Cardiac: Regular rate and rhythm, no murmurs rubs or gallops, no lower extremity edema.  Respiratory: Clear to auscultation bilaterally. Not using accessory muscles, speaking in full sentences.  Impression and Recommendations:    Essential hypertension, benign Overall doing significant better, switching from lisinopril/HCTZ to [employer] MotorsEdarbyclor. Also when he starts his CPAP he will do significantly better in terms of blood pressure.  Obstructive sleep apnea Severe with a positive sleep study recommending 12 cm's H2O CPAP. Order placed.  LPRD (laryngopharyngeal reflux disease) Starting  Nexium.  Rhinophyma Adding topical metronidazole.

## 2016-06-05 NOTE — Assessment & Plan Note (Signed)
Starting Nexium. 

## 2016-06-05 NOTE — Assessment & Plan Note (Signed)
Overall doing significant better, switching from lisinopril/HCTZ to [employer]Edarbyclor. Also when he starts his CPAP he will do significantly better in terms of blood pressure.

## 2016-06-05 NOTE — Assessment & Plan Note (Signed)
Adding topical metronidazole 

## 2016-06-05 NOTE — Assessment & Plan Note (Signed)
Severe with a positive sleep study recommending 12 cm's H2O CPAP. Order placed.

## 2016-06-19 ENCOUNTER — Ambulatory Visit: Payer: Self-pay | Admitting: Sports Medicine

## 2016-06-26 ENCOUNTER — Ambulatory Visit: Payer: Self-pay | Admitting: Sports Medicine

## 2016-07-03 ENCOUNTER — Other Ambulatory Visit: Payer: Self-pay

## 2016-07-03 DIAGNOSIS — G4733 Obstructive sleep apnea (adult) (pediatric): Secondary | ICD-10-CM

## 2016-07-03 MED ORDER — AMBULATORY NON FORMULARY MEDICATION: 0 refills | Status: AC

## 2016-07-17 DIAGNOSIS — G4733 Obstructive sleep apnea (adult) (pediatric): Secondary | ICD-10-CM | POA: Diagnosis not present

## 2016-08-17 DIAGNOSIS — G4733 Obstructive sleep apnea (adult) (pediatric): Secondary | ICD-10-CM | POA: Diagnosis not present

## 2016-08-21 ENCOUNTER — Encounter: Payer: Self-pay | Admitting: Sports Medicine

## 2016-08-21 ENCOUNTER — Ambulatory Visit (INDEPENDENT_AMBULATORY_CARE_PROVIDER_SITE_OTHER): Payer: BLUE CROSS/BLUE SHIELD | Admitting: Sports Medicine

## 2016-08-21 DIAGNOSIS — G4733 Obstructive sleep apnea (adult) (pediatric): Secondary | ICD-10-CM

## 2016-08-21 DIAGNOSIS — I10 Essential (primary) hypertension: Secondary | ICD-10-CM

## 2016-08-21 NOTE — Assessment & Plan Note (Signed)
Continue carvedilol, amlodipine, Edarbyclor. He has missed a month of his medications, has them all at home and will restart. Return to see me in 2 weeks to recheck blood pressure.  He is asymptomatic today.

## 2016-08-21 NOTE — Assessment & Plan Note (Signed)
At a previous visit we diagnosed sleep apnea, he is increasing his compliance with his CPAP machine.

## 2016-08-21 NOTE — Progress Notes (Signed)
  Subjective:    CC: Follow-up  HPI: Hypertension: No headaches, visual changes, chest pain, he has missed a month of his blood pressure medications and presents with extremely highly elevated blood pressures. Agree to get back on medications, he does have them at home.  Obstructive sleep apnea: Increase compliance with CPAP machine.  Past medical history:  Negative.  See flowsheet/record as well for more information.  Surgical history: Negative.  See flowsheet/record as well for more information.  Family history: Negative.  See flowsheet/record as well for more information.  Social history: Negative.  See flowsheet/record as well for more information.  Allergies, and medications have been entered into the medical record, reviewed, and no changes needed.   Review of Systems: No fevers, chills, night sweats, weight loss, chest pain, or shortness of breath.   Objective:    General: Well Developed, well nourished, and in no acute distress.  Neuro: Alert and oriented x3, extra-ocular muscles intact, sensation grossly intact.  HEENT: Normocephalic, atraumatic, pupils equal round reactive to light, neck supple, no masses, no lymphadenopathy, thyroid nonpalpable.  Skin: Warm and dry, no rashes. Cardiac: Regular rate and rhythm, no murmurs rubs or gallops, no lower extremity edema.  Respiratory: Clear to auscultation bilaterally. Not using accessory muscles, speaking in full sentences.  Impression and Recommendations:    Essential hypertension, benign Continue carvedilol, amlodipine, Edarbyclor. He has missed a month of his medications, has them all at home and will restart. Return to see me in 2 weeks to recheck blood pressure.  He is asymptomatic today.  Obstructive sleep apnea At a previous visit we diagnosed sleep apnea, he is increasing his compliance with his CPAP machine.

## 2016-08-22 LAB — COMPREHENSIVE METABOLIC PANEL
ALT: 14 U/L (ref 9–46)
AST: 13 U/L (ref 10–40)
Alkaline Phosphatase: 83 U/L (ref 40–115)
BUN: 11 mg/dL (ref 7–25)
Chloride: 99 mmol/L (ref 98–110)
Creat: 1.21 mg/dL (ref 0.60–1.35)
Glucose, Bld: 123 mg/dL — ABNORMAL HIGH (ref 65–99)
Potassium: 4.3 mmol/L (ref 3.5–5.3)
Sodium: 137 mmol/L (ref 135–146)
Total Protein: 7.4 g/dL (ref 6.1–8.1)

## 2016-08-22 LAB — COMPREHENSIVE METABOLIC PANEL WITH GFR
Albumin: 4.1 g/dL (ref 3.6–5.1)
CO2: 29 mmol/L (ref 20–31)
Calcium: 9.9 mg/dL (ref 8.6–10.3)
Total Bilirubin: 0.3 mg/dL (ref 0.2–1.2)

## 2016-08-22 LAB — HEMOGLOBIN A1C
Hgb A1c MFr Bld: 7.3 % — ABNORMAL HIGH (ref <5.7)
Mean Plasma Glucose: 163 mg/dL

## 2016-08-22 LAB — CBC WITH DIFFERENTIAL/PLATELET
Basophils Absolute: 86 {cells}/uL (ref 0–200)
Basophils Relative: 1 %
Eosinophils Absolute: 172 cells/uL (ref 15–500)
Eosinophils Relative: 2 %
HCT: 43.2 % (ref 38.5–50.0)
Hemoglobin: 14.5 g/dL (ref 13.2–17.1)
Lymphocytes Relative: 31 %
Lymphs Abs: 2666 {cells}/uL (ref 850–3900)
MCH: 28.3 pg (ref 27.0–33.0)
MCHC: 33.6 g/dL (ref 32.0–36.0)
MCV: 84.2 fL (ref 80.0–100.0)
MPV: 9.9 fL (ref 7.5–12.5)
Monocytes Absolute: 860 cells/uL (ref 200–950)
Monocytes Relative: 10 %
Neutro Abs: 4816 {cells}/uL (ref 1500–7800)
Neutrophils Relative %: 56 %
Platelets: 300 10*3/uL (ref 140–400)
RBC: 5.13 MIL/uL (ref 4.20–5.80)
RDW: 14.8 % (ref 11.0–15.0)
WBC: 8.6 K/uL (ref 3.8–10.8)

## 2016-09-04 ENCOUNTER — Encounter: Payer: Self-pay | Admitting: Sports Medicine

## 2016-09-04 ENCOUNTER — Ambulatory Visit (INDEPENDENT_AMBULATORY_CARE_PROVIDER_SITE_OTHER): Payer: BLUE CROSS/BLUE SHIELD | Admitting: Sports Medicine

## 2016-09-04 DIAGNOSIS — I10 Essential (primary) hypertension: Secondary | ICD-10-CM

## 2016-09-04 MED ORDER — HYDRALAZINE HCL 25 MG PO TABS: 25.0000 mg | ORAL_TABLET | Freq: Three times a day (TID) | ORAL | 3 refills | Status: DC

## 2016-09-04 NOTE — Assessment & Plan Note (Signed)
We are getting some improvements on max dose amlodipine, carvedilol, Edarbyclor. Adding hydralazine 25mg , 3 times a day. Return in 2 weeks to recheck. He will continue to increase his compliance with CPAP.

## 2016-09-04 NOTE — Progress Notes (Signed)
  Subjective:    CC: Blood pressure follow-up  HPI: This is a pleasant 31 year old male, he has difficult to control hypertension, currently on Edarbyclor max dose, amlodipine, carvedilol, he is increasing his compliance with his CPAP machine, unfortunately blood pressure continues to be elevated, he's had a few readings in the 130s and 140s at home but most of the time they are somewhere between 150 and 180 systolic. Denies headaches, visual changes, no chest pain. Tells me he is compliant with all of his current medications.  Past medical history:  Negative.  See flowsheet/record as well for more information.  Surgical history: Negative.  See flowsheet/record as well for more information.  Family history: Negative.  See flowsheet/record as well for more information.  Social history: Negative.  See flowsheet/record as well for more information.  Allergies, and medications have been entered into the medical record, reviewed, and no changes needed.   Review of Systems: No fevers, chills, night sweats, weight loss, chest pain, or shortness of breath.   Objective:    General: Well Developed, well nourished, and in no acute distress.  Neuro: Alert and oriented x3, extra-ocular muscles intact, sensation grossly intact.  HEENT: Normocephalic, atraumatic, pupils equal round reactive to light, neck supple, no masses, no lymphadenopathy, thyroid nonpalpable.  Skin: Warm and dry, no rashes. Cardiac: Regular rate and rhythm, no murmurs rubs or gallops, no lower extremity edema.  Respiratory: Clear to auscultation bilaterally. Not using accessory muscles, speaking in full sentences.  Impression and Recommendations:    Essential hypertension, benign We are getting some improvements on max dose amlodipine, carvedilol, Edarbyclor. Adding hydralazine 25mg , 3 times a day. Return in 2 weeks to recheck. He will continue to increase his compliance with CPAP.

## 2016-09-17 DIAGNOSIS — G4733 Obstructive sleep apnea (adult) (pediatric): Secondary | ICD-10-CM | POA: Diagnosis not present

## 2016-09-25 ENCOUNTER — Ambulatory Visit: Payer: Self-pay | Admitting: Sports Medicine

## 2016-10-02 ENCOUNTER — Ambulatory Visit: Payer: Self-pay | Admitting: Sports Medicine

## 2016-10-05 ENCOUNTER — Ambulatory Visit: Payer: Self-pay | Admitting: Sports Medicine

## 2016-10-09 ENCOUNTER — Encounter: Payer: Self-pay | Admitting: Sports Medicine

## 2016-10-09 ENCOUNTER — Ambulatory Visit (INDEPENDENT_AMBULATORY_CARE_PROVIDER_SITE_OTHER): Payer: BLUE CROSS/BLUE SHIELD | Admitting: Sports Medicine

## 2016-10-09 VITALS — BP 143/76 | HR 73 | Wt 332.0 lb

## 2016-10-09 DIAGNOSIS — IMO0001 Reserved for inherently not codable concepts without codable children: Secondary | ICD-10-CM

## 2016-10-09 DIAGNOSIS — Z23 Encounter for immunization: Secondary | ICD-10-CM | POA: Diagnosis not present

## 2016-10-09 DIAGNOSIS — E6609 Other obesity due to excess calories: Secondary | ICD-10-CM

## 2016-10-09 DIAGNOSIS — I10 Essential (primary) hypertension: Secondary | ICD-10-CM | POA: Diagnosis not present

## 2016-10-09 DIAGNOSIS — Z6841 Body Mass Index (BMI) 40.0 and over, adult: Secondary | ICD-10-CM

## 2016-10-09 MED ORDER — HYDRALAZINE HCL 50 MG PO TABS: 50.0000 mg | ORAL_TABLET | Freq: Three times a day (TID) | ORAL | 3 refills | Status: DC

## 2016-10-09 NOTE — Assessment & Plan Note (Addendum)
Blood pressure is the best it has looked yet. Because it still slightly elevated I am going to increase his hydralazine to 50 mg 3 times a day. He will continue his maximum dose amlodipine, carvedilol, Edarbyclor. He does endorse compliance with CPAP. At this point he is stable enough to refer to bariatric surgery, he does have a BMI of 50.

## 2016-10-09 NOTE — Assessment & Plan Note (Signed)
At this point he is stable enough to refer to bariatric surgery, he does have a BMI of 50.

## 2016-10-09 NOTE — Progress Notes (Signed)
  Subjective:    CC: Follow-up hypertension  HPI: Richard Mooney is a pleasant 31 year old male, he has had very difficult to control hypertension for some time now.  He has become more compliant with his CPAP, and is on maximum dose amlodipine, carvedilol, Edarbyclor, we recently added hydralazine 25mg  3 times a day the last visit and he returns today with his blood pressure looking the best it several looked. At this point he is agreeable to proceed with bariatric surgery.  Past medical history:  Negative.  See flowsheet/record as well for more information.  Surgical history: Negative.  See flowsheet/record as well for more information.  Family history: Negative.  See flowsheet/record as well for more information.  Social history: Negative.  See flowsheet/record as well for more information.  Allergies, and medications have been entered into the medical record, reviewed, and no changes needed.   Review of Systems: No fevers, chills, night sweats, weight loss, chest pain, or shortness of breath.   Objective:    General: Well Developed, well nourished, and in no acute distress.  Neuro: Alert and oriented x3, extra-ocular muscles intact, sensation grossly intact.  HEENT: Normocephalic, atraumatic, pupils equal round reactive to light, neck supple, no masses, no lymphadenopathy, thyroid nonpalpable.  Skin: Warm and dry, no rashes. Cardiac: Regular rate and rhythm, no murmurs rubs or gallops, no lower extremity edema.  Respiratory: Clear to auscultation bilaterally. Not using accessory muscles, speaking in full sentences.  Impression and Recommendations:    Essential hypertension, benign Blood pressure is the best it has looked yet. Because it still slightly elevated I am going to increase his hydralazine to 50 mg 3 times a day. He will continue his maximum dose amlodipine, carvedilol, Edarbyclor. He does endorse compliance with CPAP. At this point he is stable enough to refer to bariatric  surgery, he does have a BMI of 50.  Obesity At this point he is stable enough to refer to bariatric surgery, he does have a BMI of 50.  I spent 25 minutes with this patient, greater than 50% was face-to-face time counseling regarding the above diagnoses

## 2016-10-15 DIAGNOSIS — G4733 Obstructive sleep apnea (adult) (pediatric): Secondary | ICD-10-CM | POA: Diagnosis not present

## 2016-11-06 ENCOUNTER — Ambulatory Visit: Payer: BLUE CROSS/BLUE SHIELD | Admitting: Sports Medicine

## 2016-11-15 DIAGNOSIS — G4733 Obstructive sleep apnea (adult) (pediatric): Secondary | ICD-10-CM | POA: Diagnosis not present

## 2016-11-20 ENCOUNTER — Ambulatory Visit: Payer: BLUE CROSS/BLUE SHIELD | Admitting: Sports Medicine

## 2016-12-04 ENCOUNTER — Ambulatory Visit: Payer: BLUE CROSS/BLUE SHIELD | Admitting: Sports Medicine

## 2016-12-06 ENCOUNTER — Encounter: Payer: Self-pay | Admitting: Sports Medicine

## 2016-12-06 DIAGNOSIS — I10 Essential (primary) hypertension: Secondary | ICD-10-CM | POA: Diagnosis not present

## 2016-12-06 DIAGNOSIS — E669 Obesity, unspecified: Secondary | ICD-10-CM | POA: Diagnosis not present

## 2016-12-06 DIAGNOSIS — E1169 Type 2 diabetes mellitus with other specified complication: Secondary | ICD-10-CM | POA: Diagnosis not present

## 2016-12-07 ENCOUNTER — Other Ambulatory Visit (HOSPITAL_COMMUNITY): Payer: Self-pay | Admitting: General Surgery

## 2016-12-12 ENCOUNTER — Ambulatory Visit (INDEPENDENT_AMBULATORY_CARE_PROVIDER_SITE_OTHER): Payer: BLUE CROSS/BLUE SHIELD | Admitting: Sports Medicine

## 2016-12-12 ENCOUNTER — Encounter: Payer: Self-pay | Admitting: Sports Medicine

## 2016-12-12 ENCOUNTER — Ambulatory Visit (INDEPENDENT_AMBULATORY_CARE_PROVIDER_SITE_OTHER): Payer: BLUE CROSS/BLUE SHIELD

## 2016-12-12 DIAGNOSIS — M1712 Unilateral primary osteoarthritis, left knee: Secondary | ICD-10-CM

## 2016-12-12 DIAGNOSIS — M25562 Pain in left knee: Secondary | ICD-10-CM

## 2016-12-12 DIAGNOSIS — L819 Disorder of pigmentation, unspecified: Secondary | ICD-10-CM | POA: Diagnosis not present

## 2016-12-12 DIAGNOSIS — L219 Seborrheic dermatitis, unspecified: Secondary | ICD-10-CM | POA: Diagnosis not present

## 2016-12-12 DIAGNOSIS — M25561 Pain in right knee: Secondary | ICD-10-CM | POA: Diagnosis not present

## 2016-12-12 NOTE — Progress Notes (Addendum)
  Subjective:    CC: Left knee pain  HPI: For several months this pleasant 31 year old male has had pain and swelling in his left knee, medial joint line, under the kneecap, worse with weightbearing, moderate, persistent without radiation.  Past medical history:  Negative.  See flowsheet/record as well for more information.  Surgical history: Negative.  See flowsheet/record as well for more information.  Family history: Negative.  See flowsheet/record as well for more information.  Social history: Negative.  See flowsheet/record as well for more information.  Allergies, and medications have been entered into the medical record, reviewed, and no changes needed.   Review of Systems: No fevers, chills, night sweats, weight loss, chest pain, or shortness of breath.   Objective:    General: Well Developed, well nourished, and in no acute distress.  Neuro: Alert and oriented x3, extra-ocular muscles intact, sensation grossly intact.  HEENT: Normocephalic, atraumatic, pupils equal round reactive to light, neck supple, no masses, no lymphadenopathy, thyroid nonpalpable.  Skin: Warm and dry, no rashes. Cardiac: Regular rate and rhythm, no murmurs rubs or gallops, no lower extremity edema.  Respiratory: Clear to auscultation bilaterally. Not using accessory muscles, speaking in full sentences. Left Knee: Visibly swollen, palpable fluid wave, tender at the joint lines. ROM normal in flexion and extension and lower leg rotation. Ligaments with solid consistent endpoints including ACL, PCL, LCL, MCL. Negative Mcmurray's and provocative meniscal tests. Non painful patellar compression. Patellar and quadriceps tendons unremarkable. Hamstring and quadriceps strength is normal.  Procedure: Real-time Ultrasound Guided aspiration/injection of left knee Device: GE Logiq E  Verbal informed consent obtained.  Time-out conducted.  Noted no overlying erythema, induration, or other signs of local  infection.  Skin prepped in a sterile fashion.  Local anesthesia: Topical Ethyl chloride.  With sterile technique and under real time ultrasound guidance:  Using 18-gauge needle aspirated 80 mL straw-colored fluid, syringe switched and 1 mL Kenalog 40, 2 mL lidocaine, 2 mL bupivacaine injected easily. Completed without difficulty  Pain immediately resolved suggesting accurate placement of the medication.  Advised to call if fevers/chills, erythema, induration, drainage, or persistent bleeding.  Images permanently stored and available for review in the ultrasound unit.  Impression: Technically successful ultrasound guided injection.  Impression and Recommendations:    Primary osteoarthritis of left knee Aspiration and injection as above. X-rays. Few sessions of formal physical therapy. He does have gastric sleeve coming up, the weight loss will certainly help as well.

## 2016-12-12 NOTE — Assessment & Plan Note (Signed)
Aspiration and injection as above. X-rays. Few sessions of formal physical therapy. He does have gastric sleeve coming up, the weight loss will certainly help as well.

## 2016-12-15 DIAGNOSIS — G4733 Obstructive sleep apnea (adult) (pediatric): Secondary | ICD-10-CM | POA: Diagnosis not present

## 2016-12-20 ENCOUNTER — Ambulatory Visit: Payer: Self-pay | Admitting: Rehabilitative and Restorative Service Providers"

## 2016-12-26 ENCOUNTER — Ambulatory Visit: Payer: Self-pay | Admitting: Registered"

## 2016-12-27 ENCOUNTER — Ambulatory Visit: Payer: Self-pay | Admitting: Rehabilitative and Restorative Service Providers"

## 2016-12-28 ENCOUNTER — Encounter (HOSPITAL_COMMUNITY): Payer: Self-pay

## 2016-12-28 ENCOUNTER — Ambulatory Visit (HOSPITAL_COMMUNITY)
Admission: RE | Admit: 2016-12-28 | Discharge: 2016-12-28 | Disposition: A | Discharge status: Home / Self Care | Payer: BLUE CROSS/BLUE SHIELD | Source: Ambulatory Visit | Attending: General Surgery | Admitting: General Surgery

## 2016-12-28 DIAGNOSIS — Z01818 Encounter for other preprocedural examination: Secondary | ICD-10-CM | POA: Insufficient documentation

## 2017-01-09 ENCOUNTER — Ambulatory Visit: Payer: Self-pay | Admitting: Sports Medicine

## 2017-01-15 ENCOUNTER — Encounter: Payer: Self-pay | Admitting: Registered"

## 2017-01-15 ENCOUNTER — Encounter: Payer: BLUE CROSS/BLUE SHIELD | Attending: General Surgery | Admitting: Registered"

## 2017-01-15 DIAGNOSIS — E119 Type 2 diabetes mellitus without complications: Secondary | ICD-10-CM | POA: Diagnosis not present

## 2017-01-15 DIAGNOSIS — I10 Essential (primary) hypertension: Secondary | ICD-10-CM | POA: Diagnosis not present

## 2017-01-15 DIAGNOSIS — G4733 Obstructive sleep apnea (adult) (pediatric): Secondary | ICD-10-CM | POA: Diagnosis not present

## 2017-01-15 DIAGNOSIS — Z6841 Body Mass Index (BMI) 40.0 and over, adult: Secondary | ICD-10-CM | POA: Insufficient documentation

## 2017-01-15 DIAGNOSIS — Z713 Dietary counseling and surveillance: Secondary | ICD-10-CM | POA: Diagnosis not present

## 2017-01-15 NOTE — Progress Notes (Signed)
Pre-Op Assessment Visit:  Pre-Operative Sleeve Gastrectomy Surgery  Medical Nutrition Therapy:  Appt start time: 10:00  End time:  11:15  Patient was seen on 01/15/2017 for Pre-Operative Nutrition Assessment. [facility] on 01/15/2017.   Pt expectation of surgery: help to learn limits with fullness, restriction  Pt expectation of Dietitian: accountability, what to eat and not to eat  Start weight at [facility]: 332.0 BMI: 54.41   Pt states some confusion on if he has been diagnosed with diabetes. Pt states his last A1c (7.3) was Jan 2018. Pt states he only eats once a day. 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 Pt states his fiance' does all food shopping and preparing meals but doesn't like vegetables, therefore a lot of vegetables aren't included in daily meals. Pt states he is unsure if he will have surgery due to the necessary post-op  commitments  and also because he used to work at another hospital and has seen pts hospitalized related to post-op complications.  Pt is unsure of number of visits needed prior to surgery. Pt will contact [facility] and let us know if he needs SWL visits with us or Pre-op class next.    24 hr Dietary Recall: First Meal: 2 bananas Snack: none Second Meal: none Snack: none Third Meal: spaghetti or pizza or lasagna Snack: none Beverages: water, soda  Encouraged to engage in 150 minutes of moderate physical activity including cardiovascular and weight baring weekly  Handouts given during visit include:  . Pre-Op Goals . Bariatric Surgery Protein Shakes . Vitamin and Mineral Recommendations  During the appointment today the following Pre-Op Goals were reviewed with the patient: . Maintain or lose weight as instructed by your  surgeon . Make healthy food choices . Begin to limit portion sizes . Limited concentrated sugars and fried foods . Keep fat/sugar in the single digits per serving on          food labels . Practice CHEWING your food  (aim for 30 chews per bite or until applesauce consistency) . Practice not drinking 15 minutes before, during, and 30 minutes after each meal/snack . Avoid all carbonated beverages  . Avoid/limit caffeinated beverages  . Avoid all sugar-sweetened beverages . Consume 3 meals per day; eat every 3-5 hours . Make a list of non-food related activities . Aim for 64-100 ounces of FLUID daily  . Aim for at least 60-80 grams of PROTEIN daily . Look for a liquid protein source that contain ?15 g protein and ?5 g carbohydrate  (ex: shakes, drinks, shots) . Physical activity is an important part of a healthy lifestyle so keep it moving!   Follow diet recommendations listed below Energy and Macronutrient Recommendations: Calories: 1800 Carbohydrate: 200 Protein: 135 Fat: 50  Demonstrated degree of understanding via:  Teach Back   Teaching Method Utilized:  Visual Auditory Hands on  Barriers to learning/adherence to lifestyle change: work- and home-life balance  Patient to call the [facility] to enroll in Pre-Op and Post-Op Nutrition Education when surgery date is scheduled.

## 2017-02-04 ENCOUNTER — Ambulatory Visit: Payer: BLUE CROSS/BLUE SHIELD | Admitting: Rehabilitative and Restorative Service Providers"

## 2017-02-14 ENCOUNTER — Ambulatory Visit: Payer: BLUE CROSS/BLUE SHIELD | Admitting: Rehabilitative and Restorative Service Providers"

## 2017-02-14 DIAGNOSIS — G4733 Obstructive sleep apnea (adult) (pediatric): Secondary | ICD-10-CM | POA: Diagnosis not present

## 2017-03-17 DIAGNOSIS — G4733 Obstructive sleep apnea (adult) (pediatric): Secondary | ICD-10-CM | POA: Diagnosis not present

## 2017-04-17 DIAGNOSIS — G4733 Obstructive sleep apnea (adult) (pediatric): Secondary | ICD-10-CM | POA: Diagnosis not present

## 2017-04-19 ENCOUNTER — Ambulatory Visit: Payer: Self-pay | Admitting: Sports Medicine

## 2017-05-17 ENCOUNTER — Ambulatory Visit: Payer: Self-pay | Admitting: Sports Medicine

## 2017-05-17 DIAGNOSIS — G4733 Obstructive sleep apnea (adult) (pediatric): Secondary | ICD-10-CM | POA: Diagnosis not present

## 2017-05-31 ENCOUNTER — Encounter: Payer: Self-pay | Admitting: Sports Medicine

## 2017-05-31 ENCOUNTER — Ambulatory Visit (INDEPENDENT_AMBULATORY_CARE_PROVIDER_SITE_OTHER): Payer: BLUE CROSS/BLUE SHIELD | Admitting: Sports Medicine

## 2017-05-31 DIAGNOSIS — I10 Essential (primary) hypertension: Secondary | ICD-10-CM

## 2017-05-31 MED ORDER — HYDRALAZINE HCL 100 MG PO TABS: 100.0000 mg | ORAL_TABLET | Freq: Three times a day (TID) | ORAL | 3 refills | Status: DC

## 2017-05-31 NOTE — Assessment & Plan Note (Signed)
Still elevated in spite of max dose amlodipine, Edarbiclor, carvedilol, CPAP. Increasing hydralazine to 100 mg 3 times per day. He does have gastric sleeve coming up. Return in 2 weeks for a blood pressure check.

## 2017-05-31 NOTE — Progress Notes (Signed)
  Subjective:    CC: Follow-up   HPI: Hypertension: Uncontrolled, no headaches, visual changes, chest pain, he has been compliant with his multiple antihypertensives, he did get somewhat lost to follow-up regarding his sleeve gastrectomy.  Past medical history:  Negative.  See flowsheet/record as well for more information.  Surgical history: Negative.  See flowsheet/record as well for more information.  Family history: Negative.  See flowsheet/record as well for more information.  Social history: Negative.  See flowsheet/record as well for more information.  Allergies, and medications have been entered into the medical record, reviewed, and no changes needed.   Review of Systems: No fevers, chills, night sweats, weight loss, chest pain, or shortness of breath.   Objective:    General: Well Developed, well nourished, and in no acute distress.  Neuro: Alert and oriented x3, extra-ocular muscles intact, sensation grossly intact.  HEENT: Normocephalic, atraumatic, pupils equal round reactive to light, neck supple, no masses, no lymphadenopathy, thyroid nonpalpable.  Skin: Warm and dry, no rashes. Cardiac: Regular rate and rhythm, no murmurs rubs or gallops, no lower extremity edema.  Respiratory: Clear to auscultation bilaterally. Not using accessory muscles, speaking in full sentences.  Impression and Recommendations:    Essential hypertension, benign Still elevated in spite of max dose amlodipine, Edarbiclor, carvedilol, CPAP. Increasing hydralazine to 100 mg 3 times per day. He does have gastric sleeve coming up. Return in 2 weeks for a blood pressure check.  I spent 25 minutes with this patient, greater than 50% was face-to-face time counseling regarding the above diagnoses ___________________________________________ Ihor Austinhomas J. Benjamin Stainhekkekandam, M.D., ABFM., CAQSM. Primary Care and Sports Medicine [facility]  Adjunct Instructor of Family Medicine  [employer]

## 2017-06-07 ENCOUNTER — Encounter: Payer: Self-pay | Admitting: Sports Medicine

## 2017-06-14 ENCOUNTER — Ambulatory Visit: Payer: Self-pay | Admitting: Sports Medicine

## 2017-06-17 DIAGNOSIS — L219 Seborrheic dermatitis, unspecified: Secondary | ICD-10-CM | POA: Diagnosis not present

## 2017-06-17 DIAGNOSIS — G4733 Obstructive sleep apnea (adult) (pediatric): Secondary | ICD-10-CM | POA: Diagnosis not present

## 2017-06-17 DIAGNOSIS — L819 Disorder of pigmentation, unspecified: Secondary | ICD-10-CM | POA: Diagnosis not present

## 2017-06-18 ENCOUNTER — Encounter: Payer: Self-pay | Admitting: Sports Medicine

## 2017-06-19 ENCOUNTER — Other Ambulatory Visit: Payer: Self-pay

## 2017-06-19 DIAGNOSIS — E119 Type 2 diabetes mellitus without complications: Secondary | ICD-10-CM

## 2017-06-19 DIAGNOSIS — I10 Essential (primary) hypertension: Secondary | ICD-10-CM

## 2017-06-19 MED ORDER — CARVEDILOL 25 MG PO TABS: 25.0000 mg | ORAL_TABLET | Freq: Two times a day (BID) | ORAL | 3 refills | Status: DC

## 2017-06-19 MED ORDER — AZILSARTAN-CHLORTHALIDONE 40-25 MG PO TABS: 1.0000 | ORAL_TABLET | Freq: Every day | ORAL | 3 refills | Status: DC

## 2017-06-19 MED ORDER — DAPAGLIFLOZIN PROPANEDIOL 10 MG PO TABS: 10.0000 mg | ORAL_TABLET | Freq: Every day | ORAL | 3 refills | Status: DC

## 2017-06-19 MED ORDER — AMLODIPINE BESYLATE 10 MG PO TABS
10.0000 mg | ORAL_TABLET | Freq: Every day | ORAL | 3 refills | Status: DC
Start: 2017-06-19 — End: 2018-05-26

## 2017-06-25 ENCOUNTER — Encounter: Payer: Self-pay | Admitting: Sports Medicine

## 2017-06-25 ENCOUNTER — Ambulatory Visit (INDEPENDENT_AMBULATORY_CARE_PROVIDER_SITE_OTHER): Payer: BLUE CROSS/BLUE SHIELD | Admitting: Sports Medicine

## 2017-06-25 DIAGNOSIS — I10 Essential (primary) hypertension: Secondary | ICD-10-CM | POA: Diagnosis not present

## 2017-06-25 NOTE — Assessment & Plan Note (Signed)
Currently on max doses of amlodipine, Edarbyclor, carvedilol, hydralazine, using CPAP. Renal function is acceptable. He really has not been using the return in 2 weeks to recheck blood pressure, either way I am going to clear him for sleeve gastrectomy.

## 2017-06-25 NOTE — Progress Notes (Signed)
  Subjective:    CC: Follow-up blood pressure  HPI: This is a pleasant 31 year old male, he is now on maximal doses of amlodipine, Edarbyclor, carvedilol, hydralazine, compliant with CPAP, he has acceptable renal function.  Blood pressure continues to be elevated but no headaches, visual changes, chest pain.  He does have sleeve gastrectomy coming up.  He really has only started his hydralazine 2 weeks ago, blood pressure is still elevated.  Past medical history:  Negative.  See flowsheet/record as well for more information.  Surgical history: Negative.  See flowsheet/record as well for more information.  Family history: Negative.  See flowsheet/record as well for more information.  Social history: Negative.  See flowsheet/record as well for more information.  Allergies, and medications have been entered into the medical record, reviewed, and no changes needed.   Review of Systems: No fevers, chills, night sweats, weight loss, chest pain, or shortness of breath.   Objective:    General: Well Developed, well nourished, and in no acute distress.  Neuro: Alert and oriented x3, extra-ocular muscles intact, sensation grossly intact.  HEENT: Normocephalic, atraumatic, pupils equal round reactive to light, neck supple, no masses, no lymphadenopathy, thyroid nonpalpable.  Skin: Warm and dry, no rashes. Cardiac: Regular rate and rhythm, no murmurs rubs or gallops, no lower extremity edema.  Respiratory: Clear to auscultation bilaterally. Not using accessory muscles, speaking in full sentences.  Impression and Recommendations:    Essential hypertension, benign Currently on max doses of amlodipine, Edarbyclor, carvedilol, hydralazine, using CPAP. Renal function is acceptable. He really has not been using the return in 2 weeks to recheck blood pressure, either way I am going to clear him for sleeve gastrectomy.  I spent 25 minutes with this patient, greater than 50% was face-to-face time  counseling regarding the above diagnoses ___________________________________________ Ihor Austinhomas J. Benjamin Stainhekkekandam, M.D., ABFM., CAQSM. Primary Care and Sports Medicine [facility]  Adjunct Instructor of Family Medicine  [employer]

## 2017-07-01 ENCOUNTER — Encounter: Payer: BLUE CROSS/BLUE SHIELD | Attending: General Surgery | Admitting: Skilled Nursing Facility1

## 2017-07-01 DIAGNOSIS — Z6841 Body Mass Index (BMI) 40.0 and over, adult: Secondary | ICD-10-CM | POA: Diagnosis not present

## 2017-07-01 DIAGNOSIS — Z713 Dietary counseling and surveillance: Secondary | ICD-10-CM | POA: Diagnosis not present

## 2017-07-01 DIAGNOSIS — E119 Type 2 diabetes mellitus without complications: Secondary | ICD-10-CM | POA: Diagnosis not present

## 2017-07-02 ENCOUNTER — Encounter: Payer: Self-pay | Admitting: Skilled Nursing Facility1

## 2017-07-02 NOTE — Progress Notes (Signed)
Pre-Operative Nutrition Class:  Appt start time: 5520   End time:  1830.  Patient was seen on 07/01/17 for Pre-Operative Bariatric Surgery Education at [facility].   Surgery date: 07/16/2017 Surgery type: Sleeve Start weight at [facility]: 332.0 Weight today: 342.3  Samples given per MNT protocol. Patient educated on appropriate usage: Bariatric Advantage Multivitamin Lot # [id number] Exp: 07/19  Bariatric Advantage Calcium  Lot # [id number] Exp: 05-09-2018  Unjury Protein  Shake Lot # [id number] Exp: may-28-19  The following the learning objectives were met by the patient during this course:  Identify Pre-Op Dietary Goals and will begin 2 weeks pre-operatively  Identify appropriate sources of fluids and proteins   State protein recommendations and appropriate sources pre and post-operatively  Identify Post-Operative Dietary Goals and will follow for 2 weeks post-operatively  Identify appropriate multivitamin and calcium sources  Describe the need for physical activity post-operatively and will follow MD recommendations  State when to call healthcare provider regarding medication questions or post-operative complications  Handouts given during class include:  Pre-Op Bariatric Surgery Diet Handout  Protein Shake Handout  Post-Op Bariatric Surgery Nutrition Handout  [facility] Program Information Flyer  Support Group Information Flyer  [facility] Bariatric Supplements Price List  [facility]: [facility] advancement per MD.

## 2017-07-08 NOTE — Progress Notes (Signed)
Preop on 12/6.  Need orders in epic.   

## 2017-07-08 NOTE — Patient Instructions (Addendum)
Richard Mooney  07/08/2017   Your procedure is scheduled on: 07-16-17  Report to [facility] Main  Entrance Take Richard Mooney to 3rd floor to  Short Stay Center at 11:37 AM.    Call this number if you have problems the morning of surgery [phone]    Remember: ONLY 1 PERSON MAY GO WITH YOU TO SHORT STAY TO GET  READY MORNING OF YOUR SURGERY.  Do not eat food or drink liquids :After Midnight.You may have a Clear Liquid Diet from Midnight until 7:37 AM. After 7:37 AM, nothing until after surgery.      CLEAR LIQUID DIET   Foods Allowed                                                                     Foods Excluded  Coffee and tea, regular and decaf                             liquids that you cannot  Plain Jell-O in any flavor                                             see through such as: Fruit ices (not with fruit pulp)                                     milk, soups, orange juice  Iced Popsicles                                    All solid food Carbonated beverages, regular and diet                                    Cranberry, grape and apple juices Sports drinks like Gatorade Lightly seasoned clear broth or consume(fat free) Sugar, honey syrup  Sample Menu Breakfast                                Lunch                                     Supper Cranberry juice                    Beef broth                            Chicken broth Jell-O                                     Grape juice  Apple juice Coffee or tea                        Jell-O                                      Popsicle                                                Coffee or tea                        Coffee or tea  _____________________________________________________________________    Take these medicines the morning of surgery with A SIP OF WATER: Amlodipine (Norvasc), Carvediolol (Coreg), and Hydralazine (Apressoline) DO NOT TAKE ANY DIABETIC  MEDICATIONS DAY OF YOUR SURGERY                               You may not have any metal on your body including hair pins and              piercings  Do not wear jewelry, lotions, powders or deodorant             Men may shave face and neck.   Do not bring valuables to the hospital. [facility] IS NOT             RESPONSIBLE   FOR VALUABLES.  Contacts, dentures or bridgework may not be worn into surgery.  Leave suitcase in the car. After surgery it may be brought to your room.    Special Instructions: Please bring your masking and tubing for your CPAP machine               Please read over the following fact sheets you were given: _____________________________________________________________________  How to Manage Your Diabetes Before and After Surgery  Why is it important to control my blood sugar before and after surgery? . Improving blood sugar levels before and after surgery helps healing and can limit problems. . A way of improving blood sugar control is eating a healthy diet by: o  Eating less sugar and carbohydrates o  Increasing activity/exercise o  Talking with your doctor about reaching your blood sugar goals . High blood sugars (greater than 180 mg/dL) can raise your risk of infections and slow your recovery, so you will need to focus on controlling your diabetes during the weeks before surgery. . Make sure that the doctor who takes care of your diabetes knows about your planned surgery including the date and location.  How do I manage my blood sugar before surgery? . Check your blood sugar at least 4 times a day, starting 2 days before surgery, to make sure that the level is not too high or low. o Check your blood sugar the morning of your surgery when you wake up and every 2 hours until you get to the Short Stay unit. . If your blood sugar is less than 70 mg/dL, you will need to treat for low blood sugar: o Do not take insulin. o Treat a low blood sugar (less than 70  mg/dL) with  cup of clear juice (cranberry or apple), 4 glucose tablets,  OR glucose gel. o Recheck blood sugar in 15 minutes after treatment (to make sure it is greater than 70 mg/dL). If your blood sugar is not greater than 70 mg/dL on recheck, call [phone] for further instructions. . Report your blood sugar to the short stay nurse when you get to Short Stay.  . If you are admitted to the hospital after surgery: o Your blood sugar will be checked by the staff and you will probably be given insulin after surgery (instead of oral diabetes medicines) to make sure you have good blood sugar levels. o The goal for blood sugar control after surgery is 80-180 mg/dL.   WHAT DO I DO ABOUT MY DIABETES MEDICATION?  Marland Kitchen Do not take oral diabetes medicines (pills) the morning of surgery.  . THE DAY BEFORE SURGERY, take your usual dose Farxiga       Patient Signature:  Date:   Nurse Signature:  Date:   Reviewed and Endorsed by [facility] Patient Education Committee, August 2015           [facility] - Preparing for Surgery Before surgery, you can play an important role.  Because skin is not sterile, your skin needs to be as free of germs as possible.  You can reduce the number of germs on your skin by washing with CHG (chlorahexidine gluconate) soap before surgery.  CHG is an antiseptic cleaner which kills germs and bonds with the skin to continue killing germs even after washing. Please DO NOT use if you have an allergy to CHG or antibacterial soaps.  If your skin becomes reddened/irritated stop using the CHG and inform your nurse when you arrive at Short Stay. Do not shave (including legs and underarms) for at least 48 hours prior to the first CHG shower.  You may shave your face/neck. Please follow these instructions carefully:  1.  Shower with CHG Soap the night before surgery and the  morning of Surgery.  2.  If you choose to wash your hair, wash your hair first as usual with your  normal   shampoo.  3.  After you shampoo, rinse your hair and body thoroughly to remove the  shampoo.                           4.  Use CHG as you would any other liquid soap.  You can apply chg directly  to the skin and wash                       Gently with a scrungie or clean washcloth.  5.  Apply the CHG Soap to your body ONLY FROM THE NECK DOWN.   Do not use on face/ open                           Wound or open sores. Avoid contact with eyes, ears mouth and genitals (private parts).                       Wash face,  Genitals (private parts) with your normal soap.             6.  Wash thoroughly, paying special attention to the area where your surgery  will be performed.  7.  Thoroughly rinse your body with warm water from the neck down.  8.  DO NOT shower/wash with your normal  soap after using and rinsing off  the CHG Soap.                9.  Pat yourself dry with a clean towel.            10.  Wear clean pajamas.            11.  Place clean sheets on your bed the night of your first shower and do not  sleep with pets. Day of Surgery : Do not apply any lotions/deodorants the morning of surgery.  Please wear clean clothes to the hospital/surgery center.  FAILURE TO FOLLOW THESE INSTRUCTIONS MAY RESULT IN THE CANCELLATION OF YOUR SURGERY PATIENT SIGNATURE_________________________________  NURSE SIGNATURE__________________________________  ________________________________________________________________________   Rogelia MireIncentive Spirometer  An incentive spirometer is a tool that can help keep your lungs clear and active. This tool measures how well you are filling your lungs with each breath. Taking long deep breaths may help reverse or decrease the chance of developing breathing (pulmonary) problems (especially infection) following:  A long period of time when you are unable to move or be active. BEFORE THE PROCEDURE   If the spirometer includes an indicator to show your best effort, your nurse or  respiratory therapist will set it to a desired goal.  If possible, sit up straight or lean slightly forward. Try not to slouch.  Hold the incentive spirometer in an upright position. INSTRUCTIONS FOR USE  1. Sit on the edge of your bed if possible, or sit up as far as you can in bed or on a chair. 2. Hold the incentive spirometer in an upright position. 3. Breathe out normally. 4. Place the mouthpiece in your mouth and seal your lips tightly around it. 5. Breathe in slowly and as deeply as possible, raising the piston or the ball toward the top of the column. 6. Hold your breath for 3-5 seconds or for as long as possible. Allow the piston or ball to fall to the bottom of the column. 7. Remove the mouthpiece from your mouth and breathe out normally. 8. Rest for a few seconds and repeat Steps 1 through 7 at least 10 times every 1-2 hours when you are awake. Take your time and take a few normal breaths between deep breaths. 9. The spirometer may include an indicator to show your best effort. Use the indicator as a goal to work toward during each repetition. 10. After each set of 10 deep breaths, practice coughing to be sure your lungs are clear. If you have an incision (the cut made at the time of surgery), support your incision when coughing by placing a pillow or rolled up towels firmly against it. Once you are able to get out of bed, walk around indoors and cough well. You may stop using the incentive spirometer when instructed by your caregiver.  RISKS AND COMPLICATIONS  Take your time so you do not get dizzy or light-headed.  If you are in pain, you may need to take or ask for pain medication before doing incentive spirometry. It is harder to take a deep breath if you are having pain. AFTER USE  Rest and breathe slowly and easily.  It can be helpful to keep track of a log of your progress. Your caregiver can provide you with a simple table to help with this. If you are using the  spirometer at home, follow these instructions: SEEK MEDICAL CARE IF:   You are having difficultly using the spirometer.  You have trouble using the spirometer as often as instructed.  Your pain medication is not giving enough relief while using the spirometer.  You develop fever of 100.5 F (38.1 C) or higher. SEEK IMMEDIATE MEDICAL CARE IF:   You cough up bloody sputum that had not been present before.  You develop fever of 102 F (38.9 C) or greater.  You develop worsening pain at or near the incision site. MAKE SURE YOU:   Understand these instructions.  Will watch your condition.  Will get help right away if you are not doing well or get worse. Document Released: 12/03/2006 Document Revised: 10/15/2011 Document Reviewed: 02/03/2007 ExitCare Patient Information 2014 ExitCare, [place].   ________________________________________________________________________  WHAT IS A BLOOD TRANSFUSION? Blood Transfusion Information  A transfusion is the replacement of blood or some of its parts. Blood is made up of multiple cells which provide different functions.  Red blood cells carry oxygen and are used for blood loss replacement.  White blood cells fight against infection.  Platelets control bleeding.  Plasma helps clot blood.  Other blood products are available for specialized needs, such as hemophilia or other clotting disorders. BEFORE THE TRANSFUSION  Who gives blood for transfusions?   Healthy volunteers who are fully evaluated to make sure their blood is safe. This is blood bank blood. Transfusion therapy is the safest it has ever been in the practice of medicine. Before blood is taken from a donor, a complete history is taken to make sure that person has no history of diseases nor engages in risky social behavior (examples are intravenous drug use or sexual activity with multiple partners). The donor's travel history is screened to minimize risk of transmitting  infections, such as malaria. The donated blood is tested for signs of infectious diseases, such as HIV and hepatitis. The blood is then tested to be sure it is compatible with you in order to minimize the chance of a transfusion reaction. If you or a relative donates blood, this is often done in anticipation of surgery and is not appropriate for emergency situations. It takes many days to process the donated blood. RISKS AND COMPLICATIONS Although transfusion therapy is very safe and saves many lives, the main dangers of transfusion include:   Getting an infectious disease.  Developing a transfusion reaction. This is an allergic reaction to something in the blood you were given. Every precaution is taken to prevent this. The decision to have a blood transfusion has been considered carefully by your caregiver before blood is given. Blood is not given unless the benefits outweigh the risks. AFTER THE TRANSFUSION  Right after receiving a blood transfusion, you will usually feel much better and more energetic. This is especially true if your red blood cells have gotten low (anemic). The transfusion raises the level of the red blood cells which carry oxygen, and this usually causes an energy increase.  The nurse administering the transfusion will monitor you carefully for complications. HOME CARE INSTRUCTIONS  No special instructions are needed after a transfusion. You may find your energy is better. Speak with your caregiver about any limitations on activity for underlying diseases you may have. SEEK MEDICAL CARE IF:   Your condition is not improving after your transfusion.  You develop redness or irritation at the intravenous (IV) site. SEEK IMMEDIATE MEDICAL CARE IF:  Any of the following symptoms occur over the next 12 hours:  Shaking chills.  You have a temperature by mouth above 102 F (38.9 C), not controlled  by medicine.  Chest, back, or muscle pain.  People around you feel you are  not acting correctly or are confused.  Shortness of breath or difficulty breathing.  Dizziness and fainting.  You get a rash or develop hives.  You have a decrease in urine output.  Your urine turns a dark color or changes to pink, red, or brown. Any of the following symptoms occur over the next 10 days:  You have a temperature by mouth above 102 F (38.9 C), not controlled by medicine.  Shortness of breath.  Weakness after normal activity.  The white part of the eye turns yellow (jaundice).  You have a decrease in the amount of urine or are urinating less often.  Your urine turns a dark color or changes to pink, red, or brown. Document Released: 07/20/2000 Document Revised: 10/15/2011 Document Reviewed: 03/08/2008 [facility] Patient Information 2014 [place], [place].  _______________________________________________________________________

## 2017-07-09 ENCOUNTER — Ambulatory Visit: Payer: Self-pay | Admitting: Sports Medicine

## 2017-07-10 ENCOUNTER — Other Ambulatory Visit: Payer: Self-pay | Admitting: General Surgery

## 2017-07-10 ENCOUNTER — Ambulatory Visit: Payer: Self-pay | Admitting: General Surgery

## 2017-07-11 ENCOUNTER — Encounter (HOSPITAL_COMMUNITY)
Admission: RE | Admit: 2017-07-11 | Discharge: 2017-07-11 | Disposition: A | Discharge status: Home / Self Care | Payer: BLUE CROSS/BLUE SHIELD | Source: Ambulatory Visit | Attending: General Surgery | Admitting: General Surgery

## 2017-07-11 ENCOUNTER — Other Ambulatory Visit: Payer: Self-pay

## 2017-07-11 ENCOUNTER — Encounter (HOSPITAL_COMMUNITY): Payer: Self-pay

## 2017-07-11 DIAGNOSIS — Z01812 Encounter for preprocedural laboratory examination: Secondary | ICD-10-CM | POA: Insufficient documentation

## 2017-07-11 DIAGNOSIS — Z0183 Encounter for blood typing: Secondary | ICD-10-CM | POA: Diagnosis not present

## 2017-07-11 DIAGNOSIS — Z6841 Body Mass Index (BMI) 40.0 and over, adult: Secondary | ICD-10-CM | POA: Insufficient documentation

## 2017-07-11 HISTORY — DX: Gastro-esophageal reflux disease without esophagitis: K21.9

## 2017-07-11 LAB — CBC WITH DIFFERENTIAL/PLATELET
Basophils Absolute: 0 10*3/uL (ref 0.0–0.1)
Basophils Relative: 0 %
EOS PCT: 1 %
Eosinophils Absolute: 0.1 10*3/uL (ref 0.0–0.7)
HEMATOCRIT: 43.5 % (ref 39.0–52.0)
Hemoglobin: 14.6 g/dL (ref 13.0–17.0)
LYMPHS PCT: 29 %
Lymphs Abs: 2.9 10*3/uL (ref 0.7–4.0)
MCH: 28.5 pg (ref 26.0–34.0)
MCHC: 33.6 g/dL (ref 30.0–36.0)
MCV: 85 fL (ref 78.0–100.0)
MONO ABS: 0.8 10*3/uL (ref 0.1–1.0)
MONOS PCT: 8 %
NEUTROS ABS: 6.3 10*3/uL (ref 1.7–7.7)
Neutrophils Relative %: 62 %
PLATELETS: 328 10*3/uL (ref 150–400)
RBC: 5.12 MIL/uL (ref 4.22–5.81)
RDW: 13.9 % (ref 11.5–15.5)
WBC: 10.1 10*3/uL (ref 4.0–10.5)

## 2017-07-11 LAB — GLUCOSE, CAPILLARY: GLUCOSE-CAPILLARY: 164 mg/dL — AB (ref 65–99)

## 2017-07-11 LAB — HEMOGLOBIN A1C
HEMOGLOBIN A1C: 7.6 % — AB (ref 4.8–5.6)
Mean Plasma Glucose: 171.42 mg/dL

## 2017-07-11 LAB — COMPREHENSIVE METABOLIC PANEL
ALBUMIN: 3.9 g/dL (ref 3.5–5.0)
ALK PHOS: 92 U/L (ref 38–126)
ALT: 19 U/L (ref 17–63)
AST: 21 U/L (ref 15–41)
Anion gap: 9 (ref 5–15)
BUN: 20 mg/dL (ref 6–20)
CALCIUM: 9.3 mg/dL (ref 8.9–10.3)
CHLORIDE: 100 mmol/L — AB (ref 101–111)
CO2: 26 mmol/L (ref 22–32)
Creatinine, Ser: 1.19 mg/dL (ref 0.61–1.24)
GFR calc Af Amer: >60 mL/min (ref >60)
GFR calc non Af Amer: >60 mL/min (ref >60)
GLUCOSE: 153 mg/dL — AB (ref 65–99)
Potassium: 4 mmol/L (ref 3.5–5.1)
Sodium: 135 mmol/L (ref 135–145)
Total Bilirubin: 0.7 mg/dL (ref 0.3–1.2)
Total Protein: 8 g/dL (ref 6.5–8.1)

## 2017-07-11 LAB — ABO/RH: ABO/RH(D): A POS

## 2017-07-12 DIAGNOSIS — Z9989 Dependence on other enabling machines and devices: Secondary | ICD-10-CM | POA: Diagnosis not present

## 2017-07-12 DIAGNOSIS — I10 Essential (primary) hypertension: Secondary | ICD-10-CM | POA: Diagnosis not present

## 2017-07-12 DIAGNOSIS — G4733 Obstructive sleep apnea (adult) (pediatric): Secondary | ICD-10-CM | POA: Diagnosis not present

## 2017-07-13 ENCOUNTER — Ambulatory Visit: Payer: Self-pay | Admitting: General Surgery

## 2017-07-13 NOTE — H&P (Signed)
Richard Mooney 07/12/2017 9:15 AM Location: [facility] Patient #: [id number] DOB: 12-Jul-1986 Single / Language: Richard Mooney / Race: Black or African American Male  History of Present Illness Richard Hiss M. Binnie Vonderhaar MD; 07/13/2017 2:53 PM) The patient is a 31 year old male who presents for a bariatric surgery evaluation. He comes in today for his preoperative appointment. I initially met him in May of this year. His weight at time was 322 pounds. He denies any significant medical changes since he was last seen. He has had some hoarseness of his voice and it was felt to be due to reflux so he was placed on some Nexium. Upper GI was unremarkable without any overt evidence of reflux or hiatal hernia. Chest x-ray unremarkable. Iron level was slightly low at 34. Triglyceride level 204, HDL level XXX, LDL level 102.   He denies chest pain, chest pressure, dyspnea on exertion, orthopnea, or paroxysmal nocturnal dyspnea. Interestingly he may feel a little bit short of breath just sitting down at times but this is more noticeable if he eats a large amount of food. He denies any prior blood clots including family history of blood clots. He denies any significant edema. He uses CPAP nightly. He was placed on reflux medication for complaints of having a hoarse voice in the afternoon. This has helped with his hoarseness. He states he never had burning in his chest, acid taste in the back of his throat or mouth. He denies any sensation of food or liquid getting stuck. He denies any abdominal pain. He denies any melena or hematochezia. He denies any dysuria or hematuria. He has left knee pain. He denies any TIAs or amaurosis fugax. He denies any severe migraines. He does not smoke. He does not use illicit drugs. He denies any alcohol use. He works for the city of [employer]   12/2016 He is referred by Dr Dianah Field for evaluation of weight loss surgery. He completed in our online seminar.  He is interested in a sleeve gastrectomy. It appeals to him because it does not involve rerouting of the intestine. He is interested in decreasing the number of medications he is on, improving his sleep apnea and being able to lead a more physically active life and go to the beach and feel comfortable.  His comorbidities include hypertension, obstructive sleep apnea on CPAP, diabetes mellitus, and left knee osteoarthritis     Problem List/Past Medical Richard Hiss M. Redmond Pulling, MD; 07/13/2017 2:56 PM) MORBID OBESITY (E66.01) HYPERTRIGLYCERIDEMIA (E78.1)  Past Surgical History Richard Hiss M. Redmond Pulling, MD; 07/13/2017 2:55 PM) No pertinent past surgical history  Diagnostic Studies History Richard Hiss M. Redmond Pulling, MD; 07/13/2017 2:55 PM) Colonoscopy never  Allergies Richard Hiss M. Redmond Pulling, MD; 07/13/2017 2:55 PM) No Known Allergies 12/06/2016  Medication History Richard Hiss M. Redmond Pulling, MD; 07/13/2017 2:55 PM) Zofran (4MG Tablet, 1 (one) Tablet Oral every eight hours, as needed, Taken starting 07/12/2017) Active. AmLODIPine Besylate (10MG Tablet, Oral) Active. Carvedilol (12.5MG Tablet, Oral) Active. Edarbyclor (40-25MG Tablet, Oral) Active. Esomeprazole Magnesium (40MG Capsule DR, Oral) Active. Farxiga (10MG Tablet, Oral) Active. HydrALAZINE HCl (50MG Tablet, Oral) Active. Allegra-D 24 Hour (180-240MG Tablet ER 24HR, Oral) Active. Flonase (50MCG/ACT Suspension, Nasal) Active.  Social History Richard Hiss M. Redmond Pulling, MD; 07/13/2017 2:55 PM) Caffeine use Carbonated beverages. No alcohol use No drug use Tobacco use Never smoker.  Family History Richard Hiss M. Redmond Pulling, MD; 07/13/2017 2:55 PM) Hypertension Mother.  Other Problems Richard Hiss M. Redmond Pulling, MD; 07/13/2017 2:56 PM) ESSENTIAL HYPERTENSION (I10) OBSTRUCTIVE SLEEP APNEA ON CPAP (G47.33) DIABETES MELLITUS  TYPE 2 IN OBESE (E11.69)     Review of Systems Richard Hiss M. Richard Minium MD; 07/13/2017 2:51 PM) General Not Present- Appetite Loss, Chills, Fatigue, Fever, Night Sweats,  Weight Gain and Weight Loss. Skin Not Present- Change in Wart/Mole, Dryness, Hives, Jaundice, New Lesions, Non-Healing Wounds, Rash and Ulcer. HEENT Present- Seasonal Allergies. Not Present- Earache, Hearing Loss, Hoarseness, Nose Bleed, Oral Ulcers, Ringing in the Ears, Sinus Pain, Sore Throat, Visual Disturbances, Wears glasses/contact lenses and Yellow Eyes. Respiratory Present- Snoring and Wheezing. Not Present- Bloody sputum, Chronic Cough and Difficulty Breathing. Cardiovascular Not Present- Chest Pain, Difficulty Breathing Lying Down, Leg Cramps, Palpitations, Rapid Heart Rate, Shortness of Breath and Swelling of Extremities. Gastrointestinal Not Present- Abdominal Pain, Bloating, Bloody Stool, Change in Bowel Habits, Chronic diarrhea, Constipation, Difficulty Swallowing, Excessive gas, Gets full quickly at meals, Hemorrhoids, Indigestion, Nausea, Rectal Pain and Vomiting. Musculoskeletal Present- Joint Pain and Swelling of Extremities. Not Present- Back Pain, Joint Stiffness, Muscle Pain and Muscle Weakness. Neurological Not Present- Decreased Memory, Fainting, Headaches, Numbness, Seizures, Tingling, Tremor, Trouble walking and Weakness. Psychiatric Not Present- Anxiety, Bipolar, Change in Sleep Pattern, Depression, Fearful and Frequent crying. Endocrine Not Present- Cold Intolerance, Excessive Hunger, Hair Changes, Heat Intolerance and New Diabetes. Hematology Not Present- Blood Thinners, Easy Bruising, Excessive bleeding, Gland problems, HIV and Persistent Infections.  Vitals (Janette Ranson CMA; 07/12/2017 9:19 AM) 07/12/2017 9:18 AM Weight: 328 lb Height: 65.75in Body Surface Area: 2.46 m Body Mass Index: 53.34 kg/m  Pulse: 98 (Regular)  BP: 158/94 (Sitting, Left Arm, Standard)      Physical Exam Richard Hiss M. Richard Mays MD; 07/13/2017 2:51 PM)  General Mental Status-Alert. General Appearance-Consistent with stated age. Hydration-Well hydrated. Voice-Normal. Note:  Central truncal obesity  Head and Neck Head-normocephalic, atraumatic with no lesions or palpable masses. Trachea-midline. Thyroid Gland Characteristics - normal size and consistency.  Eye Eyeball - Bilateral-Extraocular movements intact. Sclera/Conjunctiva - Bilateral-No scleral icterus.  ENMT Note: Normal external ears Normal lips  Chest and Lung Exam Chest and lung exam reveals -quiet, even and easy respiratory effort with no use of accessory muscles and on auscultation, normal breath sounds, no adventitious sounds and normal vocal resonance. Inspection Chest Wall - Normal. Back - normal.  Breast - Did not examine.  Cardiovascular Cardiovascular examination reveals -normal heart sounds, regular rate and rhythm with no murmurs and normal pedal pulses bilaterally.  Abdomen Inspection Inspection of the abdomen reveals - No Hernias. Skin - Scar - no surgical scars. Palpation/Percussion Palpation and Percussion of the abdomen reveal - Soft, Non Tender, No Rebound tenderness, No Rigidity (guarding) and No hepatosplenomegaly. Auscultation Auscultation of the abdomen reveals - Bowel sounds normal.  Peripheral Vascular Upper Extremity Palpation - Pulses bilaterally normal.  Neurologic Neurologic evaluation reveals -alert and oriented x 3 with no impairment of recent or remote memory. Mental Status-Normal.  Neuropsychiatric The patient's mood and affect are described as -normal. Judgment and Insight-insight is appropriate concerning matters relevant to self.  Musculoskeletal Normal Exam - Left-Upper Extremity Strength Normal and Lower Extremity Strength Normal. Normal Exam - Right-Upper Extremity Strength Normal and Lower Extremity Strength Normal. Note: Left knee crepitus slight swelling  Lymphatic Head & Neck  General Head & Neck Lymphatics: Bilateral - Description - Normal. Axillary - Did not examine. Femoral & Inguinal - Did not  examine.    Assessment & Plan Richard Hiss M. Lekendrick Alpern MD; 07/13/2017 2:55 PM)  MORBID OBESITY (E66.01) Impression: We reviewed his preoperative workup. We discussed his lab results. We rediscussed the typical hospitalization as well as  the typical recovery course. We discussed the diet progression as well. He is attended his preoperative education class. He was given his postoperative prescriptions today. All of his questions were asked and answered. I encouraged him to do the best he could on his preoperative meal plan.  OBSTRUCTIVE SLEEP APNEA ON CPAP (G47.33)  ESSENTIAL HYPERTENSION (I10)  DIABETES MELLITUS TYPE 2 IN OBESE (E11.69)  HYPERTRIGLYCERIDEMIA (E78.1)  Leighton Ruff. Redmond Pulling, MD, FACS General, Bariatric, & Minimally Invasive Surgery [facility], [place]

## 2017-07-13 NOTE — H&P (View-Only) (Signed)
Richard Mooney 07/12/2017 9:15 AM Location: [facility] Patient #: [id number] DOB: 1986/03/03 Single / Language: Richard Mooney / Race: Black or African American Male  History of Present Illness [name] [name] MD; 07/13/2017 2:53 PM) The patient is a 31 year old male who presents for a bariatric surgery evaluation. He comes in today for his preoperative appointment. I initially met him in May of this year. His weight at time was 322 pounds. He denies any significant medical changes since he was last seen. He has had some hoarseness of his voice and it was felt to be due to reflux so he was placed on some Nexium. Upper GI was unremarkable without any overt evidence of reflux or hiatal hernia. Chest x-ray unremarkable. Iron level was slightly low at 34. Triglyceride level 204, HDL level XXX, LDL level 102.   He denies chest pain, chest pressure, dyspnea on exertion, orthopnea, or paroxysmal nocturnal dyspnea. Interestingly he may feel a little bit short of breath just sitting down at times but this is more noticeable if he eats a large amount of food. He denies any prior blood clots including family history of blood clots. He denies any significant edema. He uses CPAP nightly. He was placed on reflux medication for complaints of having a hoarse voice in the afternoon. This has helped with his hoarseness. He states he never had burning in his chest, acid taste in the back of his throat or mouth. He denies any sensation of food or liquid getting stuck. He denies any abdominal pain. He denies any melena or hematochezia. He denies any dysuria or hematuria. He has left knee pain. He denies any TIAs or amaurosis fugax. He denies any severe migraines. He does not smoke. He does not use illicit drugs. He denies any alcohol use. He works for the city of [employer]   12/2016 He is referred by Dr Dianah Field for evaluation of weight loss surgery. He completed in our online seminar.  He is interested in a sleeve gastrectomy. It appeals to him because it does not involve rerouting of the intestine. He is interested in decreasing the number of medications he is on, improving his sleep apnea and being able to lead a more physically active life and go to the beach and feel comfortable.  His comorbidities include hypertension, obstructive sleep apnea on CPAP, diabetes mellitus, and left knee osteoarthritis     Problem List/Past Medical [name], MD; 07/13/2017 2:56 PM) MORBID OBESITY (E66.01) HYPERTRIGLYCERIDEMIA (E78.1)  Past Surgical History [name], MD; 07/13/2017 2:55 PM) No pertinent past surgical history  Diagnostic Studies History [name], MD; 07/13/2017 2:55 PM) Colonoscopy never  Allergies [name], MD; 07/13/2017 2:55 PM) No Known Allergies 12/06/2016  Medication History [name], MD; 07/13/2017 2:55 PM) Zofran (4MG Tablet, 1 (one) Tablet Oral every eight hours, as needed, Taken starting 07/12/2017) Active. AmLODIPine Besylate (10MG Tablet, Oral) Active. Carvedilol (12.5MG Tablet, Oral) Active. Edarbyclor (40-25MG Tablet, Oral) Active. Esomeprazole Magnesium (40MG Capsule DR, Oral) Active. Farxiga (10MG Tablet, Oral) Active. HydrALAZINE HCl (50MG Tablet, Oral) Active. Allegra-D 24 Hour (180-240MG Tablet ER 24HR, Oral) Active. Flonase (50MCG/ACT Suspension, Nasal) Active.  Social History [name], MD; 07/13/2017 2:55 PM) Caffeine use Carbonated beverages. No alcohol use No drug use Tobacco use Never smoker.  Family History [name], MD; 07/13/2017 2:55 PM) Hypertension Mother.  Other Problems [name], MD; 07/13/2017 2:56 PM) ESSENTIAL HYPERTENSION (I10) OBSTRUCTIVE SLEEP APNEA ON CPAP (G47.33) DIABETES MELLITUS  TYPE 2 IN OBESE (E11.69)     Review of Systems [name] [name] MD; 07/13/2017 2:51 PM) General Not Present- Appetite Loss, Chills, Fatigue, Fever, Night Sweats,  Weight Gain and Weight Loss. Skin Not Present- Change in Wart/Mole, Dryness, Hives, Jaundice, New Lesions, Non-Healing Wounds, Rash and Ulcer. HEENT Present- Seasonal Allergies. Not Present- Earache, Hearing Loss, Hoarseness, Nose Bleed, Oral Ulcers, Ringing in the Ears, Sinus Pain, Sore Throat, Visual Disturbances, Wears glasses/contact lenses and Yellow Eyes. Respiratory Present- Snoring and Wheezing. Not Present- Bloody sputum, Chronic Cough and Difficulty Breathing. Cardiovascular Not Present- Chest Pain, Difficulty Breathing Lying Down, Leg Cramps, Palpitations, Rapid Heart Rate, Shortness of Breath and Swelling of Extremities. Gastrointestinal Not Present- Abdominal Pain, Bloating, Bloody Stool, Change in Bowel Habits, Chronic diarrhea, Constipation, Difficulty Swallowing, Excessive gas, Gets full quickly at meals, Hemorrhoids, Indigestion, Nausea, Rectal Pain and Vomiting. Musculoskeletal Present- Joint Pain and Swelling of Extremities. Not Present- Back Pain, Joint Stiffness, Muscle Pain and Muscle Weakness. Neurological Not Present- Decreased Memory, Fainting, Headaches, Numbness, Seizures, Tingling, Tremor, Trouble walking and Weakness. Psychiatric Not Present- Anxiety, Bipolar, Change in Sleep Pattern, Depression, Fearful and Frequent crying. Endocrine Not Present- Cold Intolerance, Excessive Hunger, Hair Changes, Heat Intolerance and New Diabetes. Hematology Not Present- Blood Thinners, Easy Bruising, Excessive bleeding, Gland problems, HIV and Persistent Infections.  Vitals (Janette Ranson CMA; 07/12/2017 9:19 AM) 07/12/2017 9:18 AM Weight: 328 lb Height: 65.75in Body Surface Area: 2.46 m Body Mass Index: 53.34 kg/m  Pulse: 98 (Regular)  BP: 158/94 (Sitting, Left Arm, Standard)      Physical Exam [name] Casimir Barcellos MD; 07/13/2017 2:51 PM)  General Mental Status-Alert. General Appearance-Consistent with stated age. Hydration-Well hydrated. Voice-Normal. Note:  Central truncal obesity  Head and Neck Head-normocephalic, atraumatic with no lesions or palpable masses. Trachea-midline. Thyroid Gland Characteristics - normal size and consistency.  Eye Eyeball - Bilateral-Extraocular movements intact. Sclera/Conjunctiva - Bilateral-No scleral icterus.  ENMT Note: Normal external ears Normal lips  Chest and Lung Exam Chest and lung exam reveals -quiet, even and easy respiratory effort with no use of accessory muscles and on auscultation, normal breath sounds, no adventitious sounds and normal vocal resonance. Inspection Chest Wall - Normal. Back - normal.  Breast - Did not examine.  Cardiovascular Cardiovascular examination reveals -normal heart sounds, regular rate and rhythm with no murmurs and normal pedal pulses bilaterally.  Abdomen Inspection Inspection of the abdomen reveals - No Hernias. Skin - Scar - no surgical scars. Palpation/Percussion Palpation and Percussion of the abdomen reveal - Soft, Non Tender, No Rebound tenderness, No Rigidity (guarding) and No hepatosplenomegaly. Auscultation Auscultation of the abdomen reveals - Bowel sounds normal.  Peripheral Vascular Upper Extremity Palpation - Pulses bilaterally normal.  Neurologic Neurologic evaluation reveals -alert and oriented x 3 with no impairment of recent or remote memory. Mental Status-Normal.  Neuropsychiatric The patient's mood and affect are described as -normal. Judgment and Insight-insight is appropriate concerning matters relevant to self.  Musculoskeletal Normal Exam - Left-Upper Extremity Strength Normal and Lower Extremity Strength Normal. Normal Exam - Right-Upper Extremity Strength Normal and Lower Extremity Strength Normal. Note: Left knee crepitus slight swelling  Lymphatic Head & Neck  General Head & Neck Lymphatics: Bilateral - Description - Normal. Axillary - Did not examine. Femoral & Inguinal - Did not  examine.    Assessment & Plan [name] Kalese Ensz MD; 07/13/2017 2:55 PM)  MORBID OBESITY (E66.01) Impression: We reviewed his preoperative workup. We discussed his lab results. We rediscussed the typical hospitalization as well as  the typical recovery course. We discussed the diet progression as well. He is attended his preoperative education class. He was given his postoperative prescriptions today. All of his questions were asked and answered. I encouraged him to do the best he could on his preoperative meal plan.  OBSTRUCTIVE SLEEP APNEA ON CPAP (G47.33)  ESSENTIAL HYPERTENSION (I10)  DIABETES MELLITUS TYPE 2 IN OBESE (E11.69)  HYPERTRIGLYCERIDEMIA (E78.1)  Leighton Ruff. Redmond Pulling, MD, FACS General, Bariatric, & Minimally Invasive Surgery [facility], [place]

## 2017-07-16 ENCOUNTER — Inpatient Hospital Stay (HOSPITAL_COMMUNITY): Payer: BLUE CROSS/BLUE SHIELD | Admitting: Anesthesiology

## 2017-07-16 ENCOUNTER — Other Ambulatory Visit: Payer: Self-pay

## 2017-07-16 ENCOUNTER — Encounter (HOSPITAL_COMMUNITY)
Admission: RE | Disposition: A | Discharge status: Home / Self Care | Payer: Self-pay | Source: Ambulatory Visit | Attending: General Surgery

## 2017-07-16 ENCOUNTER — Inpatient Hospital Stay (HOSPITAL_COMMUNITY)
Admission: RE | Admit: 2017-07-16 | Discharge: 2017-07-17 | DRG: 621 | Disposition: A | Discharge status: Home / Self Care | Payer: BLUE CROSS/BLUE SHIELD | Source: Ambulatory Visit | Attending: General Surgery | Admitting: General Surgery

## 2017-07-16 ENCOUNTER — Encounter (HOSPITAL_COMMUNITY): Payer: Self-pay | Admitting: *Deleted

## 2017-07-16 DIAGNOSIS — E119 Type 2 diabetes mellitus without complications: Secondary | ICD-10-CM

## 2017-07-16 DIAGNOSIS — I10 Essential (primary) hypertension: Secondary | ICD-10-CM | POA: Diagnosis not present

## 2017-07-16 DIAGNOSIS — M1712 Unilateral primary osteoarthritis, left knee: Secondary | ICD-10-CM | POA: Diagnosis not present

## 2017-07-16 DIAGNOSIS — K449 Diaphragmatic hernia without obstruction or gangrene: Secondary | ICD-10-CM | POA: Diagnosis not present

## 2017-07-16 DIAGNOSIS — E781 Pure hyperglyceridemia: Secondary | ICD-10-CM | POA: Diagnosis present

## 2017-07-16 DIAGNOSIS — G4733 Obstructive sleep apnea (adult) (pediatric): Secondary | ICD-10-CM | POA: Diagnosis not present

## 2017-07-16 DIAGNOSIS — Z6841 Body Mass Index (BMI) 40.0 and over, adult: Secondary | ICD-10-CM

## 2017-07-16 DIAGNOSIS — Z8249 Family history of ischemic heart disease and other diseases of the circulatory system: Secondary | ICD-10-CM | POA: Diagnosis not present

## 2017-07-16 DIAGNOSIS — E782 Mixed hyperlipidemia: Secondary | ICD-10-CM | POA: Diagnosis present

## 2017-07-16 DIAGNOSIS — K219 Gastro-esophageal reflux disease without esophagitis: Secondary | ICD-10-CM | POA: Diagnosis not present

## 2017-07-16 DIAGNOSIS — Z9884 Bariatric surgery status: Secondary | ICD-10-CM

## 2017-07-16 DIAGNOSIS — E669 Obesity, unspecified: Secondary | ICD-10-CM | POA: Diagnosis present

## 2017-07-16 HISTORY — PX: LAPAROSCOPIC GASTRIC SLEEVE RESECTION: SHX5895

## 2017-07-16 LAB — GLUCOSE, CAPILLARY
GLUCOSE-CAPILLARY: 131 mg/dL — AB (ref 65–99)
GLUCOSE-CAPILLARY: 165 mg/dL — AB (ref 65–99)
GLUCOSE-CAPILLARY: 172 mg/dL — AB (ref 65–99)
GLUCOSE-CAPILLARY: 148 mg/dL — AB (ref 65–99)
Glucose-Capillary: 167 mg/dL — ABNORMAL HIGH (ref 65–99)

## 2017-07-16 LAB — HEMOGLOBIN AND HEMATOCRIT, BLOOD
HCT: 42.4 % (ref 39.0–52.0)
Hemoglobin: 13.9 g/dL (ref 13.0–17.0)

## 2017-07-16 SURGERY — GASTRECTOMY, SLEEVE, LAPAROSCOPIC
Anesthesia: General | Site: Abdomen

## 2017-07-16 MED ORDER — CHLORHEXIDINE GLUCONATE 4 % EX LIQD
60.0000 mL | Freq: Once | CUTANEOUS | Status: AC
Start: 2017-07-16 — End: 2017-07-16
  Administered 2017-07-16: 4 via TOPICAL

## 2017-07-16 MED ORDER — PHENYLEPHRINE HCL 10 MG/ML IJ SOLN
INTRAMUSCULAR | Status: DC | PRN
  Administered 2017-07-16: 80 ug via INTRAVENOUS
  Administered 2017-07-16 (×2): 40 ug via INTRAVENOUS
  Administered 2017-07-16 (×2): 80 ug via INTRAVENOUS

## 2017-07-16 MED ORDER — ACETAMINOPHEN 500 MG PO TABS
1000.0000 mg | ORAL_TABLET | ORAL | Status: AC
  Administered 2017-07-16: 1000 mg via ORAL
  Filled 2017-07-16: qty 2

## 2017-07-16 MED ORDER — LACTATED RINGERS IV SOLN
INTRAVENOUS | Status: DC
  Administered 2017-07-16 (×2): via INTRAVENOUS

## 2017-07-16 MED ORDER — OXYCODONE HCL 5 MG/5ML PO SOLN: 5.0000 mg | ORAL | Status: DC | PRN

## 2017-07-16 MED ORDER — LIDOCAINE 2% (20 MG/ML) 5 ML SYRINGE
INTRAMUSCULAR | Status: AC
  Filled 2017-07-16: qty 5

## 2017-07-16 MED ORDER — BUPIVACAINE LIPOSOME 1.3 % IJ SUSP
20.0000 mL | Freq: Once | INTRAMUSCULAR | Status: DC
  Filled 2017-07-16 (×4): qty 20

## 2017-07-16 MED ORDER — ACETAMINOPHEN 160 MG/5ML PO SOLN
650.0000 mg | Freq: Four times a day (QID) | ORAL | Status: DC
  Administered 2017-07-17 (×3): 650 mg via ORAL
  Filled 2017-07-16 (×3): qty 20.3

## 2017-07-16 MED ORDER — LIP MEDEX EX OINT
TOPICAL_OINTMENT | CUTANEOUS | Status: AC
  Filled 2017-07-16: qty 7

## 2017-07-16 MED ORDER — PREMIER PROTEIN SHAKE: 2.0000 [oz_av] | ORAL | Status: DC

## 2017-07-16 MED ORDER — ROCURONIUM BROMIDE 50 MG/5ML IV SOSY
PREFILLED_SYRINGE | INTRAVENOUS | Status: AC
  Filled 2017-07-16: qty 5

## 2017-07-16 MED ORDER — FENTANYL CITRATE (PF) 100 MCG/2ML IJ SOLN
INTRAMUSCULAR | Status: DC | PRN
  Administered 2017-07-16: 100 ug via INTRAVENOUS

## 2017-07-16 MED ORDER — INSULIN ASPART 100 UNIT/ML ~~LOC~~ SOLN
0.0000 [IU] | SUBCUTANEOUS | Status: DC
  Administered 2017-07-16: 4 [IU] via SUBCUTANEOUS
  Administered 2017-07-17: 3 [IU] via SUBCUTANEOUS
  Administered 2017-07-17: 4 [IU] via SUBCUTANEOUS
  Administered 2017-07-17: 3 [IU] via SUBCUTANEOUS

## 2017-07-16 MED ORDER — METOPROLOL TARTRATE 5 MG/5ML IV SOLN
5.0000 mg | Freq: Four times a day (QID) | INTRAVENOUS | Status: DC | PRN
  Administered 2017-07-17: 5 mg via INTRAVENOUS
  Filled 2017-07-16: qty 5

## 2017-07-16 MED ORDER — SODIUM CHLORIDE 0.9 % IJ SOLN
INTRAMUSCULAR | Status: DC | PRN
  Administered 2017-07-16: 50 mL via INTRAVENOUS

## 2017-07-16 MED ORDER — SUGAMMADEX SODIUM 500 MG/5ML IV SOLN
INTRAVENOUS | Status: AC
  Filled 2017-07-16: qty 5

## 2017-07-16 MED ORDER — ONDANSETRON HCL 4 MG/2ML IJ SOLN: 4.0000 mg | Freq: Four times a day (QID) | INTRAMUSCULAR | Status: DC | PRN

## 2017-07-16 MED ORDER — CEFOTETAN DISODIUM-DEXTROSE 2-2.08 GM-%(50ML) IV SOLR
2.0000 g | INTRAVENOUS | Status: AC
  Administered 2017-07-16: 2 g via INTRAVENOUS
  Filled 2017-07-16: qty 50

## 2017-07-16 MED ORDER — LACTATED RINGERS IR SOLN
Status: DC | PRN
  Administered 2017-07-16: 1000 mL

## 2017-07-16 MED ORDER — STERILE WATER FOR IRRIGATION IR SOLN
Status: DC | PRN
  Administered 2017-07-16: 500 mL

## 2017-07-16 MED ORDER — PHENYLEPHRINE 40 MCG/ML (10ML) SYRINGE FOR IV PUSH (FOR BLOOD PRESSURE SUPPORT)
PREFILLED_SYRINGE | INTRAVENOUS | Status: AC
  Filled 2017-07-16: qty 10

## 2017-07-16 MED ORDER — ROCURONIUM BROMIDE 50 MG/5ML IV SOSY
PREFILLED_SYRINGE | INTRAVENOUS | Status: DC | PRN
  Administered 2017-07-16: 50 mg via INTRAVENOUS
  Administered 2017-07-16 (×3): 20 mg via INTRAVENOUS
  Administered 2017-07-16: 10 mg via INTRAVENOUS

## 2017-07-16 MED ORDER — DEXAMETHASONE SODIUM PHOSPHATE 10 MG/ML IJ SOLN
INTRAMUSCULAR | Status: DC | PRN
  Administered 2017-07-16: 10 mg via INTRAVENOUS

## 2017-07-16 MED ORDER — ONDANSETRON HCL 4 MG/2ML IJ SOLN
INTRAMUSCULAR | Status: AC
  Filled 2017-07-16: qty 2

## 2017-07-16 MED ORDER — ENALAPRILAT 1.25 MG/ML IV SOLN
1.2500 mg | Freq: Four times a day (QID) | INTRAVENOUS | Status: DC | PRN
  Administered 2017-07-17 (×2): 1.25 mg via INTRAVENOUS
  Filled 2017-07-16 (×2): qty 1

## 2017-07-16 MED ORDER — SCOPOLAMINE 1 MG/3DAYS TD PT72
1.0000 | MEDICATED_PATCH | TRANSDERMAL | Status: DC
  Administered 2017-07-16: 1.5 mg via TRANSDERMAL
  Filled 2017-07-16: qty 1

## 2017-07-16 MED ORDER — LIDOCAINE 2% (20 MG/ML) 5 ML SYRINGE
INTRAMUSCULAR | Status: DC | PRN
  Administered 2017-07-16: 100 mg via INTRAVENOUS

## 2017-07-16 MED ORDER — MIDAZOLAM HCL 2 MG/2ML IJ SOLN
INTRAMUSCULAR | Status: AC
  Filled 2017-07-16: qty 2

## 2017-07-16 MED ORDER — ONDANSETRON HCL 4 MG/2ML IJ SOLN
INTRAMUSCULAR | Status: DC | PRN
  Administered 2017-07-16: 4 mg via INTRAVENOUS

## 2017-07-16 MED ORDER — GABAPENTIN 250 MG/5ML PO SOLN
200.0000 mg | Freq: Two times a day (BID) | ORAL | Status: DC
  Administered 2017-07-16 – 2017-07-17 (×2): 200 mg via ORAL
  Filled 2017-07-16 (×3): qty 4

## 2017-07-16 MED ORDER — SIMETHICONE 80 MG PO CHEW: 80.0000 mg | CHEWABLE_TABLET | Freq: Four times a day (QID) | ORAL | Status: DC | PRN

## 2017-07-16 MED ORDER — DIPHENHYDRAMINE HCL 50 MG/ML IJ SOLN: 12.5000 mg | Freq: Three times a day (TID) | INTRAMUSCULAR | Status: DC | PRN

## 2017-07-16 MED ORDER — SUGAMMADEX SODIUM 200 MG/2ML IV SOLN
INTRAVENOUS | Status: AC
  Filled 2017-07-16: qty 2

## 2017-07-16 MED ORDER — FENTANYL CITRATE (PF) 100 MCG/2ML IJ SOLN
INTRAMUSCULAR | Status: AC
  Filled 2017-07-16: qty 2

## 2017-07-16 MED ORDER — DEXAMETHASONE SODIUM PHOSPHATE 10 MG/ML IJ SOLN
INTRAMUSCULAR | Status: AC
  Filled 2017-07-16: qty 1

## 2017-07-16 MED ORDER — LABETALOL HCL 5 MG/ML IV SOLN
5.0000 mg | INTRAVENOUS | Status: DC | PRN
  Administered 2017-07-16: 5 mg via INTRAVENOUS

## 2017-07-16 MED ORDER — BUPIVACAINE LIPOSOME 1.3 % IJ SUSP
INTRAMUSCULAR | Status: DC | PRN
  Administered 2017-07-16: 20 mL

## 2017-07-16 MED ORDER — ENOXAPARIN SODIUM 30 MG/0.3ML ~~LOC~~ SOLN
30.0000 mg | Freq: Two times a day (BID) | SUBCUTANEOUS | Status: DC
Start: 2017-07-16 — End: 2017-07-17
  Administered 2017-07-17 (×2): 30 mg via SUBCUTANEOUS
  Filled 2017-07-16 (×2): qty 0.3

## 2017-07-16 MED ORDER — MORPHINE SULFATE (PF) 2 MG/ML IV SOLN
1.0000 mg | INTRAVENOUS | Status: DC | PRN
  Administered 2017-07-16 – 2017-07-17 (×2): 2 mg via INTRAVENOUS
  Filled 2017-07-16 (×2): qty 1

## 2017-07-16 MED ORDER — CHLORHEXIDINE GLUCONATE 4 % EX LIQD: 60.0000 mL | Freq: Once | CUTANEOUS | Status: DC

## 2017-07-16 MED ORDER — LABETALOL HCL 5 MG/ML IV SOLN
INTRAVENOUS | Status: AC
  Filled 2017-07-16: qty 4

## 2017-07-16 MED ORDER — STERILE WATER FOR IRRIGATION IR SOLN
Status: DC | PRN
  Administered 2017-07-16: 2000 mL

## 2017-07-16 MED ORDER — 0.9 % SODIUM CHLORIDE (POUR BTL) OPTIME
TOPICAL | Status: DC | PRN
  Administered 2017-07-16: 1000 mL

## 2017-07-16 MED ORDER — HEPARIN SODIUM (PORCINE) 5000 UNIT/ML IJ SOLN
5000.0000 [IU] | INTRAMUSCULAR | Status: AC
  Administered 2017-07-16: 5000 [IU] via SUBCUTANEOUS
  Filled 2017-07-16: qty 1

## 2017-07-16 MED ORDER — DEXAMETHASONE SODIUM PHOSPHATE 4 MG/ML IJ SOLN: 4.0000 mg | INTRAMUSCULAR | Status: DC

## 2017-07-16 MED ORDER — SODIUM CHLORIDE 0.9 % IJ SOLN
INTRAMUSCULAR | Status: AC
  Filled 2017-07-16: qty 50

## 2017-07-16 MED ORDER — PROMETHAZINE HCL 25 MG/ML IJ SOLN: 12.5000 mg | Freq: Four times a day (QID) | INTRAMUSCULAR | Status: DC | PRN

## 2017-07-16 MED ORDER — PANTOPRAZOLE SODIUM 40 MG IV SOLR
40.0000 mg | Freq: Every day | INTRAVENOUS | Status: DC
  Administered 2017-07-16: 40 mg via INTRAVENOUS
  Filled 2017-07-16: qty 40

## 2017-07-16 MED ORDER — SUGAMMADEX SODIUM 500 MG/5ML IV SOLN
INTRAVENOUS | Status: DC | PRN
  Administered 2017-07-16: 300 mg via INTRAVENOUS

## 2017-07-16 MED ORDER — PROPOFOL 10 MG/ML IV BOLUS
INTRAVENOUS | Status: AC
  Filled 2017-07-16: qty 20

## 2017-07-16 MED ORDER — PROPOFOL 10 MG/ML IV BOLUS
INTRAVENOUS | Status: DC | PRN
  Administered 2017-07-16: 50 mg via INTRAVENOUS
  Administered 2017-07-16: 200 mg via INTRAVENOUS

## 2017-07-16 MED ORDER — APREPITANT 40 MG PO CAPS
40.0000 mg | ORAL_CAPSULE | ORAL | Status: AC
  Administered 2017-07-16: 40 mg via ORAL
  Filled 2017-07-16: qty 1

## 2017-07-16 MED ORDER — FENTANYL CITRATE (PF) 100 MCG/2ML IJ SOLN
25.0000 ug | INTRAMUSCULAR | Status: DC | PRN
  Administered 2017-07-16: 25 ug via INTRAVENOUS
  Administered 2017-07-16: 50 ug via INTRAVENOUS

## 2017-07-16 MED ORDER — GABAPENTIN 300 MG PO CAPS
300.0000 mg | ORAL_CAPSULE | ORAL | Status: AC
  Administered 2017-07-16: 300 mg via ORAL
  Filled 2017-07-16: qty 1

## 2017-07-16 MED ORDER — POTASSIUM CHLORIDE IN NACL 20-0.45 MEQ/L-% IV SOLN
INTRAVENOUS | Status: DC
  Administered 2017-07-16 – 2017-07-17 (×2): via INTRAVENOUS
  Filled 2017-07-16 (×4): qty 1000

## 2017-07-16 MED ORDER — MIDAZOLAM HCL 2 MG/2ML IJ SOLN
INTRAMUSCULAR | Status: DC | PRN
  Administered 2017-07-16: 2 mg via INTRAVENOUS

## 2017-07-16 SURGICAL SUPPLY — 65 items
APPLICATOR COTTON TIP 6IN STRL (MISCELLANEOUS) IMPLANT
APPLIER CLIP ROT 10 11.4 M/L (STAPLE)
APPLIER CLIP ROT 13.4 12 LRG (CLIP)
BANDAGE ADH SHEER 1  50/CT (GAUZE/BANDAGES/DRESSINGS) ×12 IMPLANT
BENZOIN TINCTURE PRP APPL 2/3 (GAUZE/BANDAGES/DRESSINGS) ×2 IMPLANT
BLADE SURG SZ11 CARB STEEL (BLADE) ×2 IMPLANT
CABLE HIGH FREQUENCY MONO STRZ (ELECTRODE) ×2 IMPLANT
CHLORAPREP W/TINT 26ML (MISCELLANEOUS) ×4 IMPLANT
CLIP APPLIE ROT 10 11.4 M/L (STAPLE) IMPLANT
CLIP APPLIE ROT 13.4 12 LRG (CLIP) IMPLANT
COVER SURGICAL LIGHT HANDLE (MISCELLANEOUS) ×2 IMPLANT
DECANTER SPIKE VIAL GLASS SM (MISCELLANEOUS) ×2 IMPLANT
DEVICE SUT QUICK LOAD TK 5 (STAPLE) ×2 IMPLANT
DEVICE SUT TI-KNOT TK 5X26 (MISCELLANEOUS) ×2 IMPLANT
DEVICE SUTURE ENDOST 10MM (ENDOMECHANICALS) ×2 IMPLANT
DISSECTOR BLUNT TIP ENDO 5MM (MISCELLANEOUS) ×2 IMPLANT
DRAPE UTILITY XL STRL (DRAPES) ×4 IMPLANT
ELECT L-HOOK LAP 45CM DISP (ELECTROSURGICAL)
ELECT PENCIL ROCKER SW 15FT (MISCELLANEOUS) IMPLANT
ELECT REM PT RETURN 15FT ADLT (MISCELLANEOUS) ×2 IMPLANT
ELECTRODE L-HOOK LAP 45CM DISP (ELECTROSURGICAL) IMPLANT
GAUZE SPONGE 2X2 8PLY STRL LF (GAUZE/BANDAGES/DRESSINGS) IMPLANT
GAUZE SPONGE 4X4 12PLY STRL (GAUZE/BANDAGES/DRESSINGS) IMPLANT
GLOVE BIO SURGEON STRL SZ7.5 (GLOVE) ×2 IMPLANT
GLOVE INDICATOR 8.0 STRL GRN (GLOVE) ×2 IMPLANT
GOWN STRL REUS W/TWL XL LVL3 (GOWN DISPOSABLE) ×8 IMPLANT
GRASPER SUT TROCAR 14GX15 (MISCELLANEOUS) ×2 IMPLANT
HOVERMATT SINGLE USE (MISCELLANEOUS) ×2 IMPLANT
KIT BASIN OR (CUSTOM PROCEDURE TRAY) ×2 IMPLANT
MARKER SKIN DUAL TIP RULER LAB (MISCELLANEOUS) ×2 IMPLANT
NEEDLE SPNL 22GX3.5 QUINCKE BK (NEEDLE) ×2 IMPLANT
PACK UNIVERSAL I (CUSTOM PROCEDURE TRAY) ×2 IMPLANT
RELOAD STAPLER 60MM BLK (STAPLE) ×1 IMPLANT
RELOAD STAPLER BLUE 60MM (STAPLE) ×1 IMPLANT
RELOAD STAPLER GOLD 60MM (STAPLE) ×3 IMPLANT
RELOAD STAPLER GREEN 60MM (STAPLE) ×1 IMPLANT
SCISSORS LAP 5X45 EPIX DISP (ENDOMECHANICALS) ×2 IMPLANT
SEALANT SURGICAL APPL DUAL CAN (MISCELLANEOUS) IMPLANT
SET IRRIG TUBING LAPAROSCOPIC (IRRIGATION / IRRIGATOR) ×2 IMPLANT
SHEARS HARMONIC ACE PLUS 45CM (MISCELLANEOUS) ×2 IMPLANT
SLEEVE GASTRECTOMY 40FR VISIGI (MISCELLANEOUS) ×2 IMPLANT
SLEEVE XCEL OPT CAN 5 100 (ENDOMECHANICALS) ×6 IMPLANT
SOLUTION ANTI FOG 6CC (MISCELLANEOUS) ×2 IMPLANT
SPONGE GAUZE 2X2 STER 10/PKG (GAUZE/BANDAGES/DRESSINGS)
SPONGE LAP 18X18 X RAY DECT (DISPOSABLE) ×2 IMPLANT
STAPLER ECHELON BIOABSB 60 FLE (MISCELLANEOUS) ×10 IMPLANT
STAPLER ECHELON LONG 60 440 (INSTRUMENTS) ×2 IMPLANT
STAPLER RELOAD 60MM BLK (STAPLE) ×2
STAPLER RELOAD BLUE 60MM (STAPLE) ×2
STAPLER RELOAD GOLD 60MM (STAPLE) ×6
STAPLER RELOAD GREEN 60MM (STAPLE) ×2
STRIP CLOSURE SKIN 1/2X4 (GAUZE/BANDAGES/DRESSINGS) ×2 IMPLANT
SUT MNCRL AB 4-0 PS2 18 (SUTURE) ×4 IMPLANT
SUT SURGIDAC NAB ES-9 0 48 120 (SUTURE) ×2 IMPLANT
SUT VICRYL 0 TIES 12 18 (SUTURE) ×2 IMPLANT
SYR 20CC LL (SYRINGE) ×2 IMPLANT
SYR 50ML LL SCALE MARK (SYRINGE) ×2 IMPLANT
TOWEL OR 17X26 10 PK STRL BLUE (TOWEL DISPOSABLE) ×2 IMPLANT
TOWEL OR NON WOVEN STRL DISP B (DISPOSABLE) ×2 IMPLANT
TRAY FOLEY W/METER SILVER 16FR (SET/KITS/TRAYS/PACK) IMPLANT
TROCAR BLADELESS 15MM (ENDOMECHANICALS) ×2 IMPLANT
TROCAR BLADELESS OPT 5 100 (ENDOMECHANICALS) ×2 IMPLANT
TUBING CONNECTING 10 (TUBING) ×4 IMPLANT
TUBING ENDO SMARTCAP (MISCELLANEOUS) ×2 IMPLANT
TUBING INSUF HEATED (TUBING) ×2 IMPLANT

## 2017-07-16 NOTE — Anesthesia Preprocedure Evaluation (Signed)
Anesthesia Evaluation  Patient identified by MRN, date of birth, ID band Patient awake    Reviewed: Allergy & Precautions, H&P , Patient's Chart, lab work & pertinent test results, reviewed documented beta blocker date and time   Airway Mallampati: II  TM Distance: >3 FB Neck ROM: full    Dental no notable dental hx.    Pulmonary sleep apnea ,    Pulmonary exam normal breath sounds clear to auscultation       Cardiovascular hypertension,  Rhythm:regular Rate:Normal     Neuro/Psych    GI/Hepatic   Endo/Other  diabetesMorbid obesity  Renal/GU      Musculoskeletal   Abdominal   Peds  Hematology   Anesthesia Other Findings   Reproductive/Obstetrics                             Anesthesia Physical Anesthesia Plan  ASA: III  Anesthesia Plan: General   Post-op Pain Management:    Induction: Intravenous  PONV Risk Score and Plan: 2 and Dexamethasone, Ondansetron and Treatment may vary due to age or medical condition  Airway Management Planned: Oral ETT and Video Laryngoscope Planned  Additional Equipment:   Intra-op Plan:   Post-operative Plan: Extubation in OR  Informed Consent: I have reviewed the patients History and Physical, chart, labs and discussed the procedure including the risks, benefits and alternatives for the proposed anesthesia with the patient or authorized representative who has indicated his/her understanding and acceptance.   Dental Advisory Given  Plan Discussed with: CRNA and Surgeon  Anesthesia Plan Comments: (  )        Anesthesia Quick Evaluation

## 2017-07-16 NOTE — Op Note (Signed)
07/16/2017 Richard Mooney 10/06/1985 [id number]   PRE-OPERATIVE DIAGNOSIS:     Obesity BMI 50   Essential hypertension, benign   Diabetes mellitus, type 2 (HCC)   Obstructive sleep apnea   Primary osteoarthritis of left knee   Hypertriglyceridemia  POST-OPERATIVE DIAGNOSIS:  Same + hiatal hernia  PROCEDURE:  Procedure(s): LAPAROSCOPIC SLEEVE GASTRECTOMY WITH HIATAL HERNIA REPAIR UPPER GI ENDOSCOPY  SURGEON:  Surgeon(s): Atilano InaEric M Rakayla Ricklefs, MD FACS FASMBS  ASSISTANTS: Ovidio Kinavid Newman MD FACS  ANESTHESIA:   general  DRAINS: none   BOUGIE: 40 fr ViSiGi  LOCAL MEDICATIONS USED:   Exparel  EBL: 25 cc  SPECIMEN:  Source of Specimen:  Greater curvature of stomach  DISPOSITION OF SPECIMEN:  PATHOLOGY  COUNTS:  YES  INDICATION FOR PROCEDURE: This is a very pleasant 31 y.o.-year-old morbidly obese male who has had unsuccessful attempts for sustained weight loss. The patient presents today for a planned laparoscopic sleeve gastrectomy with upper endoscopy. We have discussed the risk and benefits of the procedure extensively preoperatively. Please see my separate notes.  PROCEDURE: After obtaining informed consent and receiving 5000 units of subcutaneous heparin, the patient was [facility] and placed supine on the operating room table. General endotracheal anesthesia was established. Sequential compression devices were placed. A orogastric tube was placed. The patient's abdomen was prepped and draped in the usual standard surgical fashion. The patient received preoperative IV antibiotic. A surgical timeout was performed. ERAS protocol used.   Access to the abdomen was achieved using a 5 mm 0 laparoscope thru a 5 mm trocar In the left upper Quadrant 2 fingerbreadths below the left subcostal margin using the Optiview technique. Pneumoperitoneum was smoothly established up to 15 mm of mercury. The laparoscope was advanced and the abdominal cavity was  surveilled. The patient was then placed in reverse Trendelenburg.   A 5 mm trocar was placed slightly above and to the left of the umbilicus under direct visualization.  The [facility] liver retractor was placed under the left lobe of the liver through a 5 mm trocar incision site in the subxiphoid position. He had a very large left hepatic lobe.  A 5 mm trocar was placed in the lateral right upper quadrant along with a 15 mm trocar in the mid right abdomen. A final 5 mm trocar was placed in the lateral LUQ.  All under direct visualization after exparel had been infiltrated in bilateral lateral upper abdominal walls as a TAP block.  The stomach was inspected. It was completely decompressed and the orogastric tube was removed.  There was not an anterior dimple that was obviously visible. However I was able to gently push the ge junction up thru the hiatus a little bit so I decided to test for a hiatal hernia.  His preop UGI showed no hiatal hernia.  The calibration tube was placed in the oropharynx and guided down into the stomach by the CRNA. 10 mL of air was insufflated into the calibration balloon. The calibration tubing was then gently pulled back by the CRNA and it slid past the GE junction. At this point the calibration tubing was desufflated and pulled back into the esophagus. This confirmed my suspicion of a clinically significant hiatal hernia. The gastrohepatic ligament was incised with harmonic scalpel. The right crus was identified. We identified the crossing fat along the right crus. The adipose tissue just above this area was incised with harmonic scalpel. I then bluntly dissected out this area and identified  the left crus. There was evidence of a hiatal hernia. I then mobilized the esophagus. The left and right crus were further mobilized with blunt dissection. I was then able to reapproximate the left and right crus with 0 Ethibond using an Endostitch suture device and securing it with a titanium  tyknot. We then had the CRNA readvanced the calibration tubing back into the stomach. 10 mL of air was insufflated into the calibration tube balloon. The calibration tube was then gently pulled back and there was resistance at the GE junction. The tube did not slide back up into the esophagus. At this point the calibration tubing was deflated and removed from the patient's body.   We identified the pylorus and measured 6 cm proximal to the pylorus and identified an area of where we would start taking down the short gastric vessels. Harmonic scalpel was used to take down the short gastric vessels along the greater curvature of the stomach. We were able to enter the lesser sac. We continued to march along the greater curvature of the stomach taking down the short gastrics. As we approached the gastrosplenic ligament we took care in this area not to injure the spleen. We were able to take down the entire gastrosplenic ligament. We then mobilized the fundus away from the left crus of diaphragm. There were not any significant posterior gastric avascular attachments. This left the stomach completely mobilized. No vessels had been taken down along the lesser curvature of the stomach.  We then reidentified the pylorus. A 40Fr ViSiGi was then placed in the oropharynx and advanced down into the stomach and placed in the distal antrum and positioned along the lesser curvature. It was placed under suction which secured the 40Fr ViSiGi in place along the lesser curve. Then using the Ethicon echelon 60 mm stapler with a black load with Seamguard, I placed a stapler along the antrum approximately 5 cm from the pylorus. The stapler was angled so that there is ample room at the angularis incisura. I then fired the first staple load after inspecting it posteriorly to ensure adequate space both anteriorly and posteriorly. At this point I still was not completely past the angularis so with a green load with Seamguard, I placed the  stapler in position just inside the prior stapleline. We then rotated the stomach to insure that there was adequate anteriorly as well as posteriorly. The stapler was then fired.  At this point I started using 60 mm gold load staple cartridges with Seamguard. The stomach still seemed thick so I continued with gold cartridges. The echelon stapler was then repositioned with a 60 mm gold load with Seamguard and we continued to march up along the ViSiGi. My assistant was holding traction along the greater curvature stomach along the cauterized short gastric vessels ensuring that the stomach was symmetrically retracted. Prior to each firing of the staple, we rotated the stomach to ensure that there is adequate stomach left.  As we approached the fundus, I used 60 mm blue cartridge with Seamguard aiming  lateral to the GE junction after mobilizing some of the esophageal fat pad.  The sleeve was inspected. There is no evidence of cork screw. The staple line appeared hemostatic. The CRNA inflated the ViSiGi to the green zone and the upper abdomen was flooded with saline. There were no bubbles. The sleeve was decompressed and the ViSiGi removed. My assistant scrubbed out and performed an upper endoscopy. The sleeve easily distended with air and the scope was  easily advanced to the pylorus. There is no evidence of internal bleeding or cork screwing. There was no narrowing at the angularis. There is no evidence of bubbles. Please see his operative note for further details. The gastric sleeve was decompressed and the endoscope was removed.  The greater curvature the stomach was grasped with a laparoscopic grasper and removed from the 15 mm trocar site.  The liver retractor was removed. I then closed the 15 mm trocar site with 2 interrupted 0 Vicryl sutures through the fascia using the endoclose. The closure was viewed laparoscopically and it was airtight. Remaining Exparel was then infiltrated in the preperitoneal spaces around  the trocar sites. Pneumoperitoneum was released. All trocar sites were closed with a 4-0 Monocryl in a subcuticular fashion followed by the application of benzoin, steri-strips, and bandaids. The patient was extubated and taken to the recovery room in stable condition. All needle, instrument, and sponge counts were correct x2. There are no immediate complications  (1) 60 mm black with Seamguard (1) 60 mm green with Seamguard (3) 60 mm gold with seamguard (1) 60 mm blue with seamguard  PLAN OF CARE: Admit to inpatient   PATIENT DISPOSITION:  PACU - hemodynamically stable.   Delay start of Pharmacological VTE agent (>24hrs) due to surgical blood loss or risk of bleeding:  no  Mary Sella. Andrey Campanile, MD, FACS FASMBS General, Bariatric, & Minimally Invasive Surgery [facility], [place]

## 2017-07-16 NOTE — Interval H&P Note (Signed)
History and Physical Interval Note:  07/16/2017 12:47 PM  Richard Mooney  has presented today for surgery, with the diagnosis of MORBID OBESITY  The various methods of treatment have been discussed with the patient and family. After consideration of risks, benefits and other options for treatment, the patient has consented to  Procedure(s): LAPAROSCOPIC GASTRIC SLEEVE RESECTION WITH UPPER ENDO (N/A) as a surgical intervention .  The patient's history has been reviewed, patient examined, no change in status, stable for surgery.  I have reviewed the patient's chart and labs.  Questions were answered to the patient's satisfaction.    Mary SellaEric M. Andrey CampanileWilson, MD, FACS General, Bariatric, & Minimally Invasive Surgery [facility] [place]  Gaynelle AduEric Luigi Stuckey

## 2017-07-16 NOTE — Transfer of Care (Signed)
Immediate Anesthesia Transfer of Care Note  Patient: Richard Mooney  Procedure(s) Performed: LAPAROSCOPIC GASTRIC SLEEVE RESECTION WITH UPPER ENDO (N/A Abdomen)  Patient Location: PACU  Anesthesia Type:General  Level of Consciousness: awake, alert  and oriented  Airway & Oxygen Therapy: Patient Spontanous Breathing  Post-op Assessment: Report given to RN and Post -op Vital signs reviewed and stable  Post vital signs: Reviewed and stable  Last Vitals:  Vitals:   07/16/17 1135  BP: (!) 176/91  Pulse: 80  Resp: 20  Temp: 36.7 C  SpO2: 97%    Last Pain:  Vitals:   07/16/17 1135  TempSrc: Oral         Complications: No apparent anesthesia complications

## 2017-07-16 NOTE — Anesthesia Postprocedure Evaluation (Signed)
Anesthesia Post Note  Patient: Richard Mooney  Procedure(s) Performed: LAPAROSCOPIC GASTRIC SLEEVE RESECTION WITH UPPER ENDO (N/A Abdomen)     Patient location during evaluation: PACU Anesthesia Type: General Level of consciousness: sedated Pain management: pain level controlled Vital Signs Assessment: post-procedure vital signs reviewed and stable Respiratory status: spontaneous breathing and respiratory function stable Cardiovascular status: stable Postop Assessment: no apparent nausea or vomiting Anesthetic complications: no    Last Vitals:  Vitals:   07/16/17 1700 07/16/17 1715  BP: (!) 147/74 (!) 148/82  Pulse: 68 77  Resp: (!) 21 20  Temp:  36.9 C  SpO2: 96% 99%    Last Pain:  Vitals:   07/16/17 1715  TempSrc:   PainSc: Asleep                 Richard Mooney

## 2017-07-16 NOTE — Anesthesia Procedure Notes (Signed)
Procedure Name: Intubation Date/Time: 07/16/2017 1:26 PM Performed by: Dione Booze, CRNA Pre-anesthesia Checklist: Suction available, Patient being monitored, Emergency Drugs available and Patient identified Patient Re-evaluated:Patient Re-evaluated prior to induction Oxygen Delivery Method: Circle system utilized Preoxygenation: Pre-oxygenation with 100% oxygen Induction Type: IV induction Ventilation: Two handed mask ventilation required Laryngoscope Size: Mac and 4 Grade View: Grade II Tube type: Oral Tube size: 7.5 mm Number of attempts: 1 Airway Equipment and Method: Stylet Placement Confirmation: ETT inserted through vocal cords under direct vision,  positive ETCO2 and breath sounds checked- equal and bilateral Secured at: 23 cm Tube secured with: Tape Dental Injury: Teeth and Oropharynx as per pre-operative assessment

## 2017-07-16 NOTE — Discharge Instructions (Signed)
° ° ° °GASTRIC BYPASS/SLEEVE ° Home Care Instructions ° ° These instructions are to help you care for yourself when you go home. ° °Call: If you have any problems. °• Call [phone] and ask for the surgeon on call °• If you need immediate help, come to the ER at [place].  °• Tell the ER staff that you are a new post-op gastric bypass or gastric sleeve patient °  °Signs and symptoms to report: • Severe vomiting or nausea °o If you cannot keep down clear liquids for longer than 1 day, call your surgeon  °• Abdominal pain that does not get better after taking your pain medication °• Fever over 100.4° F with chills °• Heart beating over 100 beats a minute °• Shortness of breath at rest °• Chest pain °•  Redness, swelling, drainage, or foul odor at incision (surgical) sites °•  If your incisions open or pull apart °• Swelling or pain in calf (lower leg) °• Diarrhea (Loose bowel movements that happen often), frequent watery, uncontrolled bowel movements °• Constipation, (no bowel movements for 3 days) if this happens: Pick one °o Milk of Magnesia, 2 tablespoons by mouth, 3 times a day for 2 days if needed °o Stop taking Milk of Magnesia once you have a bowel movement °o Call your doctor if constipation continues °Or °o Miralax  (instead of Milk of Magnesia) following the label instructions °o Stop taking Miralax once you have a bowel movement °o Call your doctor if constipation continues °• Anything you think is not normal °  °Normal side effects after surgery: • Unable to sleep at night or unable to focus °• Irritability or moody °• Being tearful (crying) or depressed °These are common complaints, possibly related to your anesthesia medications that put you to sleep, stress of surgery, and change in lifestyle.  This usually goes away a few weeks after surgery.  If these feelings continue, call your primary care doctor. °  °Wound Care: You may have surgical glue, steri-strips, or staples over your incisions after  surgery °• Surgical glue:  Looks like a clear film over your incisions and will wear off a little at a time °• Steri-strips: Strips of tape over your incisions. You may notice a yellowish color on the skin under the steri-strips. This is used to make the   steri-strips stick better. Do not pull the steri-strips off - let them fall off °• Staples: Staples may be removed before you leave the hospital °o If you go home with staples, call [facility], [phone] at for an appointment with your surgeon’s nurse to have staples removed 10 days after surgery. °• Showering: You may shower two (2) days after your surgery unless your surgeon tells you differently °o Wash gently around incisions with warm soapy water, rinse well, and gently pat dry  °o No tub baths until staples are removed, steri-strips fall off or glue is gone.  °  °Medications: • Medications should be liquid or crushed if larger than the size of a dime °• Extended release pills (medication that release a little bit at a time through the day) should NOT be crushed or cut. (examples include XL, ER, DR, SR) °• Depending on the size and number of medications you take, you may need to space (take a few throughout the day)/change the time you take your medications so that you do not over-fill your pouch (smaller stomach) °• Make sure you follow-up with your primary care doctor to   make medication changes needed during rapid weight loss and life-style changes °• If you have diabetes, follow up with the doctor that orders your diabetes medication(s) within one week after surgery and check your blood sugar regularly. °• Do not drive while taking prescription pain medication  °• It is ok to take Tylenol by the bottle instructions with your pain medicine or instead of your pain medicine as needed.  DO NOT TAKE NSAIDS (EXAMPLES OF NSAIDS:  IBUPROFREN/ NAPROXEN)  °Diet:                    First 2 Weeks ° You will see the dietician t about two (2) weeks  after your surgery. The dietician will increase the types of foods you can eat if you are handling liquids well: °• If you have severe vomiting or nausea and cannot keep down clear liquids lasting longer than 1 day, call your surgeon @ ([phone]) °Protein Shake °• Drink at least 2 ounces of shake 5-6 times per day °• Each serving of protein shakes (usually 8 - 12 ounces) should have: °o 15 grams of protein  °o And no more than 5 grams of carbohydrate  °• Goal for protein each day: °o Men = 80 grams per day °o Women = 60 grams per day °• Protein powder may be added to fluids such as non-fat milk or Lactaid milk or unsweetened Soy/Almond milk (limit to 35 grams added protein powder per serving) ° °Hydration °• Slowly increase the amount of water and other clear liquids as tolerated (See Acceptable Fluids) °• Slowly increase the amount of protein shake as tolerated  °•  Sip fluids slowly and throughout the day.  Do not use straws. °• May use sugar substitutes in small amounts (no more than 6 - 8 packets per day; i.e. Splenda) ° °Fluid Goal °• The first goal is to drink at least 8 ounces of protein shake/drink per day (or as directed by the nutritionist); some examples of protein shakes are Syntrax Nectar, Adkins Advantage, EAS Edge HP, and Unjury. See handout from pre-op Bariatric Education Class: °o Slowly increase the amount of protein shake you drink as tolerated °o You may find it easier to slowly sip shakes throughout the day °o It is important to get your proteins in first °• Your fluid goal is to drink 64 - 100 ounces of fluid daily °o It may take a few weeks to build up to this °• 32 oz (or more) should be clear liquids  °And  °• 32 oz (or more) should be full liquids (see below for examples) °• Liquids should not contain sugar, caffeine, or carbonation ° °Clear Liquids: °• Water or Sugar-free flavored water (i.e. Fruit H2O, Propel) °• Decaffeinated coffee or tea (sugar-free) °• Crystal Lite, Wyler’s Lite,  Minute Maid Lite °• Sugar-free Jell-O °• Bouillon or broth °• Sugar-free Popsicle:   *Less than 20 calories each; Limit 1 per day ° °Full Liquids: °Protein Shakes/Drinks + 2 choices per day of other full liquids °• Full liquids must be: °o No More Than 15 grams of Carbs per serving  °o No More Than 3 grams of Fat per serving °• Strained low-fat cream soup (except Cream of Potato or Tomato) °• Non-Fat milk °• Fat-free Lactaid Milk °• Unsweetened Soy Or Unsweetened Almond Milk °• Low Sugar yogurt (Dannon Lite & Fit, Greek yogurt; Oikos Triple Zero; Chobani Simply 100; Yoplait 100 calorie Greek - No Fruit on the Bottom) ° °  °Vitamins   and Minerals • Start 1 day after surgery unless otherwise directed by your surgeon °• 2 Chewable Bariatric Specific Multivitamin / Multimineral Supplement with iron (Example: Bariatric Advantage Multi EA) °• Chewable Calcium with Vitamin D-3 °(Example: 3 Chewable Calcium Plus 600 with Vitamin D-3) °o Take 500 mg three (3) times a day for a total of 1500 mg each day °o Do not take all 3 doses of calcium at one time as it may cause constipation, and you can only absorb 500 mg  at a time  °o Do not mix multivitamins containing iron with calcium supplements; take 2 hours apart °• Menstruating women and those with a history of anemia (a blood disease that causes weakness) may need extra iron °o Talk with your doctor to see if you need more iron °• Do not stop taking or change any vitamins or minerals until you talk to your dietitian or surgeon °• Your Dietitian and/or surgeon must approve all vitamin and mineral supplements °  °Activity and Exercise: Limit your physical activity as instructed by your doctor.  It is important to continue walking at home.  During this time, use these guidelines: °• Do not lift anything greater than ten (10) pounds for at least two (2) weeks °• Do not go back to work or drive until your surgeon says you can °• You may have sex when you feel comfortable  °o It is  VERY important for male patients to use a reliable birth control method; fertility often increases after surgery  °o All hormonal birth control will be ineffective for 30 days after surgery due to medications given during surgery a barrier method must be used. °o Do not get pregnant for at least 18 months °• Start exercising as soon as your doctor tells you that you can °o Make sure your doctor approves any physical activity °• Start with a simple walking program °• Walk 5-15 minutes each day, 7 days per week.  °• Slowly increase until you are walking 30-45 minutes per day °Consider joining our BELT program. [phone] or email [email or web address] °  °Special Instructions Things to remember: °• Use your CPAP when sleeping if this applies to you ° °• [facility] has two free Bariatric Surgery Support Groups that meet monthly °o The 3rd Thursday of each month, 6 pm, [facility]  °o The 2nd Friday of each month, 11:45 am in the private dining room in the basement of [place] °• It is very important to keep all follow up appointments with your surgeon, dietitian, primary care physician, and behavioral health practitioner °• Routine follow up schedule with your surgeon include appointments at 2-3 weeks, 6-8 weeks, 6 months, and 1 year at a minimum.  Your surgeon may request to see you more often.   °o After the first year, please follow up with your bariatric surgeon and dietitian at least once a year in order to maintain best weight loss results °[facility]: [phone] °[facility]: [phone] °Bariatric Nurse Coordinator: [phone] °  °   Reviewed and Endorsed  °by [facility] Patient Education Committee, June, 2016 °Edits Approved: Aug, 2018 ° ° ° °

## 2017-07-17 ENCOUNTER — Encounter (HOSPITAL_COMMUNITY): Payer: Self-pay

## 2017-07-17 LAB — TYPE AND SCREEN
ABO/RH(D): A POS
Antibody Screen: NEGATIVE

## 2017-07-17 LAB — COMPREHENSIVE METABOLIC PANEL
ALK PHOS: 75 U/L (ref 38–126)
ALT: 57 U/L (ref 17–63)
AST: 36 U/L (ref 15–41)
Albumin: 3.6 g/dL (ref 3.5–5.0)
Anion gap: 11 (ref 5–15)
BUN: 19 mg/dL (ref 6–20)
CHLORIDE: 100 mmol/L — AB (ref 101–111)
CO2: 26 mmol/L (ref 22–32)
CREATININE: 1.12 mg/dL (ref 0.61–1.24)
Calcium: 9 mg/dL (ref 8.9–10.3)
GFR calc Af Amer: >60 mL/min (ref >60)
Glucose, Bld: 140 mg/dL — ABNORMAL HIGH (ref 65–99)
Potassium: 3.6 mmol/L (ref 3.5–5.1)
Sodium: 137 mmol/L (ref 135–145)
Total Bilirubin: 0.7 mg/dL (ref 0.3–1.2)
Total Protein: 7.6 g/dL (ref 6.5–8.1)

## 2017-07-17 LAB — CBC WITH DIFFERENTIAL/PLATELET
BASOS ABS: 0 10*3/uL (ref 0.0–0.1)
Basophils Relative: 0 %
EOS PCT: 0 %
Eosinophils Absolute: 0 10*3/uL (ref 0.0–0.7)
HEMATOCRIT: 41.6 % (ref 39.0–52.0)
HEMOGLOBIN: 13.6 g/dL (ref 13.0–17.0)
LYMPHS PCT: 10 %
Lymphs Abs: 1.4 10*3/uL (ref 0.7–4.0)
MCH: 28 pg (ref 26.0–34.0)
MCHC: 32.7 g/dL (ref 30.0–36.0)
MCV: 85.6 fL (ref 78.0–100.0)
Monocytes Absolute: 0.7 10*3/uL (ref 0.1–1.0)
Monocytes Relative: 5 %
NEUTROS ABS: 11.6 10*3/uL — AB (ref 1.7–7.7)
NEUTROS PCT: 85 %
PLATELETS: 336 10*3/uL (ref 150–400)
RBC: 4.86 MIL/uL (ref 4.22–5.81)
RDW: 13.9 % (ref 11.5–15.5)
WBC: 13.8 10*3/uL — AB (ref 4.0–10.5)

## 2017-07-17 LAB — GLUCOSE, CAPILLARY
GLUCOSE-CAPILLARY: 147 mg/dL — AB (ref 65–99)
Glucose-Capillary: 144 mg/dL — ABNORMAL HIGH (ref 65–99)
Glucose-Capillary: 120 mg/dL — ABNORMAL HIGH (ref 65–99)

## 2017-07-17 MED ORDER — OXYCODONE HCL 5 MG/5ML PO SOLN: 5.0000 mg | ORAL | 0 refills | Status: DC | PRN

## 2017-07-17 MED ORDER — AMLODIPINE BESYLATE 10 MG PO TABS
10.0000 mg | ORAL_TABLET | Freq: Every day | ORAL | Status: DC
  Administered 2017-07-17: 10 mg via ORAL
  Filled 2017-07-17: qty 1

## 2017-07-17 MED ORDER — CARVEDILOL 25 MG PO TABS: 25.0000 mg | ORAL_TABLET | Freq: Two times a day (BID) | ORAL | Status: DC

## 2017-07-17 MED ORDER — HYDRALAZINE HCL 50 MG PO TABS: 100.0000 mg | ORAL_TABLET | Freq: Three times a day (TID) | ORAL | Status: DC

## 2017-07-17 NOTE — Progress Notes (Addendum)
Discharge instructions reviewed with patient. All questions answered. Patient wheeled down to vehicle by nurse tech with belongings. 

## 2017-07-17 NOTE — Progress Notes (Signed)
Patient ID: Richard Mooney, male   DOB: 03/29/1986, 31 y.o.   MRN: [id number]   Progress Note: Metabolic and Bariatric Surgery Service   Chief Complaint/Subjective: Doing well. ambualted multiple times. Already on shake. States water went well. No nausea.   Objective: Vital signs in last 24 hours: Temp:  [98 F (36.7 C)-98.8 F (37.1 C)] 98.6 F (37 C) (12/12 0932) Pulse Rate:  [54-88] 65 (12/12 0932) Resp:  [16-36] 18 (12/12 0932) BP: (142-195)/(74-106) 183/96 (12/12 0932) SpO2:  [93 %-99 %] 95 % (12/12 0932) Weight:  [146.9 kg (323 lb 12.8 oz)-148.7 kg (327 lb 14.4 oz)] 148.7 kg (327 lb 14.4 oz) (12/12 0920) Last BM Date: (PTA)  Intake/Output from previous day: 12/11 0701 - 12/12 0700 In: 3275 [P.O.:300; I.V.:2925; IV Piggyback:50] Out: 700 [Urine:650; Blood:50] Intake/Output this shift: Total I/O In: 120 [P.O.:120] Out: 550 [Urine:550]  Lungs: cta; symm, nonlabored  Cardiovascular: reg  Abd: obese, soft, min TTP, incisions ok  Extremities: no edema  Neuro: nonfocal, nontoxic  Lab Results: CBC  Recent Labs    07/16/17 1408 07/17/17 0522  WBC  --  13.8*  HGB 13.9 13.6  HCT 42.4 41.6  PLT  --  336   BMET Recent Labs    07/17/17 0522  NA 137  K 3.6  CL 100*  CO2 26  GLUCOSE 140*  BUN 19  CREATININE 1.12  CALCIUM 9.0   PT/INR No results for input(s): LABPROT, INR in the last 72 hours. ABG No results for input(s): PHART, HCO3 in the last 72 hours.  Invalid input(s): PCO2, PO2  Studies/Results:  Anti-infectives: Anti-infectives (From admission, onward)   Start     Dose/Rate Route Frequency Ordered Stop   07/16/17 1138  cefoTEtan in Dextrose 5% (CEFOTAN) IVPB 2 g     2 g Intravenous On call to O.R. 07/16/17 1138 07/16/17 1347      Medications: Scheduled Meds: . acetaminophen (TYLENOL) oral liquid 160 mg/5 mL  650 mg Oral Q6H  . amLODipine  10 mg Oral Daily  . carvedilol  25 mg Oral BID WC  . enoxaparin (LOVENOX) injection  30 mg Subcutaneous  Q12H  . gabapentin  200 mg Oral Q12H  . insulin aspart  0-20 Units Subcutaneous Q4H  . pantoprazole (PROTONIX) IV  40 mg Intravenous QHS  . [START ON 07/18/2017] protein supplement shake  2 oz Oral Q2H   Continuous Infusions: . 0.45 % NaCl with KCl 20 mEq / L 125 mL/hr at 07/17/17 0648   PRN Meds:.diphenhydrAMINE, enalaprilat, metoprolol tartrate, morphine injection, ondansetron (ZOFRAN) IV, oxyCODONE, promethazine, simethicone  Assessment/Plan: Patient Active Problem List   Diagnosis Date Noted  . Hypertriglyceridemia 07/16/2017  . Morbid obesity (HCC) 07/16/2017  . Primary osteoarthritis of left knee 12/12/2016  . 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 LPRD (laryngopharyngeal reflux disease) 06/05/2016  . Obstructive sleep apnea 01/10/2016  . Onychomycosis 05/12/2014  . Diabetes mellitus, type 2 (HCC) 07/24/2013  . Essential hypertension, benign 07/23/2013  . Obesity 07/23/2013  . Preventive measure 07/23/2013   s/p Procedure(s): LAPAROSCOPIC GASTRIC SLEEVE RESECTION WITH UPPER ENDO 07/16/2017  Principal Problem:   Obesity Active Problems:   Essential hypertension, benign   Diabetes mellitus, type 2 (HCC)   Obstructive sleep apnea   Primary osteoarthritis of left knee   Hypertriglyceridemia   Morbid obesity (HCC)  Doing well. No tachycardia, no fever. Looks good Cont diet as tolerated Some HTN issues - restart some home meds Cont chemical vte prophylaxis  Disposition:  LOS: 1 day  The patient should be discharged from the hospital today  Gaynelle AduEric Adrienne Trombetta, MD [phone] [facility]

## 2017-07-17 NOTE — Progress Notes (Signed)
Patient alert and oriented, Post op day 1.  Provided support and encouragement.  Encouraged pulmonary toilet, ambulation and small sips of liquids.  Patient completed 12 ounces of clear fluids to start protein shakes.  All questions answered.  Will continue to monitor.

## 2017-07-17 NOTE — Plan of Care (Signed)
Nutrition Education Note  Received consult for diet education per DROP protocol.   Discussed 2 week post op diet with pt. Emphasized that liquids must be non carbonated, non caffeinated, and sugar free. Fluid goals discussed. Pt to follow up with outpatient bariatric RD for further diet progression after 2 weeks. Multivitamins and minerals also reviewed. Teach back method used, pt expressed understanding, expect good compliance.   Diet: First 2 Weeks  You will see the nutritionist about two (2) weeks after your surgery. The nutritionist will increase the types of foods you can eat if you are handling liquids well:  If you have severe vomiting or nausea and cannot handle clear liquids lasting longer than 1 day, call your surgeon  Protein Shake  Drink at least 2 ounces of shake 5-6 times per day  Each serving of protein shakes (usually 8 - 12 ounces) should have a minimum of:  15 grams of protein  And no more than 5 grams of carbohydrate  Goal for protein each day:  Men = 80 grams per day  Women = 60 grams per day  Protein powder may be added to fluids such as non-fat milk or Lactaid milk or Soy milk (limit to 35 grams added protein powder per serving)   Hydration  Slowly increase the amount of water and other clear liquids as tolerated (See Acceptable Fluids)  Slowly increase the amount of protein shake as tolerated  Sip fluids slowly and throughout the day  May use sugar substitutes in small amounts (no more than 6 - 8 packets per day; i.e. Splenda)   Fluid Goal  The first goal is to drink at least 8 ounces of protein shake/drink per day (or as directed by the nutritionist); some examples of protein shakes are Premier Protein, Syntrax Nectar, Adkins Advantage, EAS Edge HP, and Unjury. See handout from pre-op Bariatric Education Class:  Slowly increase the amount of protein shake you drink as tolerated  You may find it easier to slowly sip shakes throughout the day  It is important to  get your proteins in first  Your fluid goal is to drink 64 - 100 ounces of fluid daily  It may take a few weeks to build up to this  32 oz (or more) should be clear liquids  And  32 oz (or more) should be full liquids (see below for examples)  Liquids should not contain sugar, caffeine, or carbonation   Clear Liquids:  Water or Sugar-free flavored water (i.e. Fruit H2O, Propel)  Decaffeinated coffee or tea (sugar-free)  Crystal Lite, Wyler?s Lite, Minute Maid Lite  Sugar-free Jell-O  Bouillon or broth  Sugar-free Popsicle: *Less than 20 calories each; Limit 1 per day   Full Liquids:  Protein Shakes/Drinks + 2 choices per day of other full liquids  Full liquids must be:  No More Than 12 grams of Carbs per serving  No More Than 3 grams of Fat per serving  Strained low-fat cream soup  Non-Fat milk  Fat-free Lactaid Milk  Sugar-free yogurt (Dannon Lite & Fit, Greek yogurt, Oikos Zero)   Jerriyah Louis RD, LDN Clinical Nutrition Pager # - [phone]   

## 2017-07-17 NOTE — Progress Notes (Signed)
Patient alert and oriented, pain is controlled. Patient is tolerating fluids, advanced to protein shake today, patient is tolerating well.  Reviewed Gastric sleeve discharge instructions with patient and patient is able to articulate understanding.  Provided information on BELT program, Support Group and [facility] outpatient pharmacy. All questions answered, will continue to monitor.  

## 2017-07-18 NOTE — Discharge Summary (Signed)
Physician Discharge Summary  Richard Mooney WUJ:[id number] DOB: 04-21-1986 DOA: 07/16/2017  PCP: Silverio Decamp, MD  Admit date: 07/16/2017 Discharge date: 07/17/2017  Recommendations for Outpatient Follow-up:   Follow-up Information    Greer Pickerel, MD. Go on 08/08/2017.   Specialty:  General Surgery Why:  at [address] information: [address] [place] [address] [phone]        Greer Pickerel, MD Follow up.   Specialty:  General Surgery Contact information: [place] [address] [phone]          Discharge Diagnoses:  Principal Problem:   Obesity Active Problems:   Essential hypertension, benign   Diabetes mellitus, type 2 ([place])   Obstructive sleep apnea   Primary osteoarthritis of left knee   Hypertriglyceridemia   Morbid obesity ([place])   Surgical Procedure: Laparoscopic Sleeve Gastrectomy with hiatal hernia repair, upper endoscopy  Discharge Condition: Good Disposition: Home  Diet recommendation: Postoperative sleeve gastrectomy diet (liquids only)  Filed Weights   07/16/17 1135 07/17/17 0920  Weight: (!) 146.9 kg (323 lb 12.8 oz) (!) 148.7 kg (327 lb 14.4 oz)     Hospital Course:  The patient was admitted for a planned laparoscopic sleeve gastrectomy. Please see operative note. Preoperatively the patient was given 5000 units of subcutaneous heparin for DVT prophylaxis. Postoperative prophylactic Lovenox dosing was started on the evening of postoperative day 0. ERAS protocol was used. On the evening of postoperative day 0, the patient was started on water and ice chips. On postoperative day 1 the patient had no fever or tachycardia and was tolerating water in their diet was gradually advanced throughout the day. The patient was ambulating without difficulty. Their vital signs are stable without fever or tachycardia. Their hemoglobin had remained stable. The patient was maintained on their home settings for  CPAP therapy. The patient had received discharge instructions and counseling. They were deemed stable for discharge and had met discharge criteria   Discharge Instructions  Discharge Instructions    Ambulate hourly while awake   Complete by:  As directed    Call MD for:  difficulty breathing, headache or visual disturbances   Complete by:  As directed    Call MD for:  persistant dizziness or light-headedness   Complete by:  As directed    Call MD for:  persistant nausea and vomiting   Complete by:  As directed    Call MD for:  redness, tenderness, or signs of infection (pain, swelling, redness, odor or green/yellow discharge around incision site)   Complete by:  As directed    Call MD for:  severe uncontrolled pain   Complete by:  As directed    Call MD for:  temperature >101 F   Complete by:  As directed    Diet bariatric full liquid   Complete by:  As directed    Discharge instructions   Complete by:  As directed    See bariatric discharge instructions   Incentive spirometry   Complete by:  As directed    Perform hourly while awake     Allergies as of 07/17/2017   No Known Allergies     Medication List    STOP taking these medications   dapagliflozin propanediol 10 MG Tabs tablet Commonly known as:  FARXIGA     TAKE these medications   acetaminophen 500 MG tablet Commonly known as:  TYLENOL Take 1,000 mg by mouth every 6 (six) hours as needed  for moderate pain or headache.   AMBULATORY NON FORMULARY MEDICATION Continuous positive airway pressure (CPAP) machine set at 12 cm of H2O pressure, with all supplemental supplies as needed.   amLODipine 10 MG tablet Commonly known as:  NORVASC Take 1 tablet (10 mg total) daily by mouth. Notes to patient:  Monitor Blood Pressure Daily and keep a log for primary care physician.  You may need to make changes to your medications with rapid weight loss.     Azilsartan-Chlorthalidone 40-25 MG Tabs Commonly known as:   EDARBYCLOR Take 1 tablet daily by mouth. Notes to patient:  Monitor Blood Pressure Daily and keep a log for primary care physician.  You may need to make changes to your medications with rapid weight loss.     carvedilol 25 MG tablet Commonly known as:  COREG Take 1 tablet (25 mg total) 2 (two) times daily with a meal by mouth. Notes to patient:  Monitor Blood Pressure Daily and keep a log for primary care physician.  You may need to make changes to your medications with rapid weight loss.     esomeprazole 40 MG capsule Commonly known as:  NEXIUM Take 1 capsule (40 mg total) by mouth every evening. What changed:  when to take this   hydrALAZINE 100 MG tablet Commonly known as:  APRESOLINE Take 1 tablet (100 mg total) by mouth 3 (three) times daily. Notes to patient:  Monitor Blood Pressure Daily and keep a log for primary care physician.  You may need to make changes to your medications with rapid weight loss.     metroNIDAZOLE 0.75 % gel Commonly known as:  METROGEL Apply 1 application topically 2 (two) times daily.   mometasone 0.1 % cream Commonly known as:  ELOCON Apply 1 application topically 2 (two) times daily.   MULTIVITAMIN PO Take 1 tablet by mouth daily.   oxyCODONE 5 MG/5ML solution Commonly known as:  ROXICODONE Take 5-10 mLs (5-10 mg total) by mouth every 4 (four) hours as needed for moderate pain or severe pain.   tacrolimus 0.1 % ointment Commonly known as:  PROTOPIC Apply 1 application topically daily.      Follow-up Information    Greer Pickerel, MD. Go on 08/08/2017.   Specialty:  General Surgery Why:  at [address] information: [address] [place] [address] [phone]        Greer Pickerel, MD Follow up.   Specialty:  General Surgery Contact information: [place] [address] [phone]            The results of significant diagnostics from this hospitalization (including imaging, microbiology,  ancillary and laboratory) are listed below for reference.    Significant Diagnostic Studies: No results found.  Labs: Basic Metabolic Panel: Recent Labs  Lab 07/17/17 0522  NA 137  K 3.6  CL 100*  CO2 26  GLUCOSE 140*  BUN 19  CREATININE 1.12  CALCIUM 9.0   Liver Function Tests: Recent Labs  Lab 07/17/17 0522  AST 36  ALT 57  ALKPHOS 75  BILITOT 0.7  PROT 7.6  ALBUMIN 3.6    CBC: Recent Labs  Lab 07/16/17 1408 07/17/17 0522  WBC  --  13.8*  NEUTROABS  --  11.6*  HGB 13.9 13.6  HCT 42.4 41.6  MCV  --  85.6  PLT  --  336    CBG: Recent Labs  Lab 07/16/17 2138 07/17/17 0001 07/17/17 0404 07/17/17 0759  07/17/17 1148  GLUCAP 148* 167* 147* 144* 120*    Principal Problem:   Obesity Active Problems:   Essential hypertension, benign   Diabetes mellitus, type 2 ([place])   Obstructive sleep apnea   Primary osteoarthritis of left knee   Hypertriglyceridemia   Morbid obesity ([place])   Time coordinating discharge: 10 min  Signed:  Gayland Curry, MD [facility] [place] [phone] 07/18/2017, 9:51 AM

## 2017-07-19 ENCOUNTER — Telehealth (HOSPITAL_COMMUNITY): Payer: Self-pay

## 2017-07-19 NOTE — Telephone Encounter (Signed)
Follow up with bariatric surgical patient to discuss post discharge questions.  No answer at this time voicemail left for patient along with contact information to discuss the following questions.  1.  Are you having any pain not relieved by pain medication?taken pain medication without difficulty  2.  How much fluid total fluid intake have you had in the last 24/48 hours?   18 ounces yesterday.  Patient stated he had a lot of gas pain and didn't drink much.  We discussed normal feelings in stomach "cramping feeling when drinking" and s/s of being full.  Patient stated he had drank more today and felt less gassy.  Fluid goal of 35 ounces set today and to email [facility] if fluid not increased  3.  How much protein intake have you had in the last 24/48 hours?30  4.  Have you had any trouble making urine?voided 4 times yesterday and this morning.  Urine is light color associated with lighter than apple juice.  5.  Have you had nausea that has not been relieved by nausea medication?no have not needed nausea medication  6.  Are you ambulating every hour?not yesterday, will work on this today, plans to shower today  7.  Are you passing gas or had a BM?yes started last night,  Discussed ways to decrease gas  8.  Do you know how to contact [facility]? [facility]? [facility]?yes  9.  Are you taking your vitamins and calcium without difficulty?no problems  10. Tell me how your incision looks?  Any redness, open incision, or drainage?Look good showering today

## 2017-07-22 ENCOUNTER — Telehealth (HOSPITAL_COMMUNITY): Payer: Self-pay

## 2017-07-22 NOTE — Telephone Encounter (Signed)
Follow up on patient fluid intake post bariatric surgery.  Since our last talk fluid has increased to over 64 ounces of total fluid and over 60 grams of protein.  Patient reports no nausea or problems with intake.  Voiced concern over a sneeze that caused pain through the night.  Incisions intake no further pain,  Discussed that pain may be intermittent with coughing sneezing or movement.  Patient voiced understanding.  [facility].  Follow up with [facility] and [facility] discussed.

## 2017-07-23 ENCOUNTER — Encounter (HOSPITAL_COMMUNITY): Payer: Self-pay | Admitting: General Surgery

## 2017-08-01 ENCOUNTER — Encounter: Payer: BLUE CROSS/BLUE SHIELD | Attending: General Surgery | Admitting: Skilled Nursing Facility1

## 2017-08-01 DIAGNOSIS — Z6841 Body Mass Index (BMI) 40.0 and over, adult: Secondary | ICD-10-CM | POA: Insufficient documentation

## 2017-08-01 DIAGNOSIS — Z713 Dietary counseling and surveillance: Secondary | ICD-10-CM | POA: Insufficient documentation

## 2017-08-01 DIAGNOSIS — E119 Type 2 diabetes mellitus without complications: Secondary | ICD-10-CM | POA: Diagnosis not present

## 2017-08-05 ENCOUNTER — Encounter: Payer: Self-pay | Admitting: Skilled Nursing Facility1

## 2017-08-05 NOTE — Progress Notes (Signed)
Bariatric Class:  Appt start time: 1530 end time:  1630.  2 Week Post-Operative Nutrition Class  Patient was seen on 08/01/2017 for Post-Operative Nutrition [facility].   Surgery date: 07/15/2017 Surgery type: Sleeve Start weight at [facility]: 332 Weight today: pt arrived too late for his wt to be taken  TANITA  BODY COMP RESULTS  N/A   BMI (kg/m^2)    Fat Mass (lbs)    Fat Free Mass (lbs)    Total Body Water (lbs)    The following the learning objectives were met by the patient during this course:  Identifies Phase 3A (Soft, High Proteins) Dietary Goals and will begin from 2 weeks post-operatively to 2 months post-operatively  Identifies appropriate sources of fluids and proteins   States protein recommendations and appropriate sources post-operatively  Identifies the need for appropriate texture modifications, mastication, and bite sizes when consuming solids  Identifies appropriate multivitamin and calcium sources post-operatively  Describes the need for physical activity post-operatively and will follow MD recommendations  States when to call healthcare provider regarding medication questions or post-operative complications  Handouts given during class include:  Phase 3A: Soft, High Protein Diet Handout  Follow-Up Plan: Patient will follow-up at [facility] in 6 weeks for 2 month post-op nutrition visit for diet advancement per MD.

## 2017-08-15 ENCOUNTER — Ambulatory Visit: Payer: BLUE CROSS/BLUE SHIELD | Admitting: Sports Medicine

## 2017-08-15 ENCOUNTER — Encounter: Payer: Self-pay | Admitting: Sports Medicine

## 2017-08-15 DIAGNOSIS — I10 Essential (primary) hypertension: Secondary | ICD-10-CM

## 2017-08-15 DIAGNOSIS — E119 Type 2 diabetes mellitus without complications: Secondary | ICD-10-CM | POA: Diagnosis not present

## 2017-08-15 NOTE — Assessment & Plan Note (Signed)
Blood pressure is now starting to drop with his weight loss, discontinue hydralazine, continue amlodipine, carvedilol, Edarbyclor. Return in 2 weeks for a blood pressure check.

## 2017-08-15 NOTE — Assessment & Plan Note (Signed)
Now status post sleeve gastrectomy, 30 pounds down, blood pressure improved.

## 2017-08-15 NOTE — Assessment & Plan Note (Signed)
Now off of all diabetes medicines, and 2-3 months we will recheck his hemoglobin A1c.

## 2017-08-15 NOTE — Progress Notes (Signed)
  Subjective:    CC: Check blood pressure  HPI: This is a pleasant 32 year old male, he is post sleeve gastrectomy, he is now 30 pounds down, he is off of his diabetes medications and blood pressure is now under control for the first time ever.  He would like some more time out of work, he does need to lift heavy weights, works in [employer], and is not yet ready to do that though his wounds are starting to heal.  I reviewed the past medical history, family history, social history, surgical history, and allergies today and no changes were needed.  Please see the problem list section below in epic for further details.  Past Medical History: Past Medical History:  Diagnosis Date  . Diabetes mellitus without complication (HCC)   . GERD (gastroesophageal reflux disease)   . Hypertension    Past Surgical History: Past Surgical History:  Procedure Laterality Date  . LAPAROSCOPIC GASTRIC SLEEVE RESECTION N/A 07/16/2017   Procedure: LAPAROSCOPIC GASTRIC SLEEVE RESECTION WITH UPPER ENDO;  Surgeon: Gaynelle AduWilson, Eric, MD;  Location: [facility];  Service: General;  Laterality: N/A;   Social History: Social History   Socioeconomic History  . Marital status: Single    Spouse name: None  . Number of children: None  . Years of education: None  . Highest education level: None  Social Needs  . Financial resource strain: None  . Food insecurity - worry: None  . Food insecurity - inability: None  . Transportation needs - medical: None  . Transportation needs - non-medical: None  Occupational History  . None  Tobacco Use  . Smoking status: Never Smoker  . Smokeless tobacco: Never Used  Substance and Sexual Activity  . Alcohol use: No  . Drug use: No  . Sexual activity: Yes  Other Topics Concern  . None  Social History Narrative  . None   Family History: Family History  Problem Relation Age of Onset  . Hyperlipidemia Mother   . Hypertension Mother    Allergies: No Known  Allergies Medications: See med rec.  Review of Systems: No fevers, chills, night sweats, weight loss, chest pain, or shortness of breath.   Objective:    General: Well Developed, well nourished, and in no acute distress.  Neuro: Alert and oriented x3, extra-ocular muscles intact, sensation grossly intact.  HEENT: Normocephalic, atraumatic, pupils equal round reactive to light, neck supple, no masses, no lymphadenopathy, thyroid nonpalpable.  Skin: Warm and dry, no rashes. Cardiac: Regular rate and rhythm, no murmurs rubs or gallops, no lower extremity edema.  Respiratory: Clear to auscultation bilaterally. Not using accessory muscles, speaking in full sentences. Abdomen: Wounds are clean, dry, intact.  Impression and Recommendations:    Morbid obesity (HCC) Now status post sleeve gastrectomy, 30 pounds down, blood pressure improved.  Essential hypertension, benign Blood pressure is now starting to drop with his weight loss, discontinue hydralazine, continue amlodipine, carvedilol, Edarbyclor. Return in 2 weeks for a blood pressure check.  Diabetes mellitus, type 2 (HCC) Now off of all diabetes medicines, and 2-3 months we will recheck his hemoglobin A1c.  ___________________________________________ Ihor Austinhomas J. Benjamin Stainhekkekandam, M.D., ABFM., CAQSM. Primary Care and Sports Medicine [place] [facility]  Adjunct Instructor of Family Medicine  [employer]

## 2017-09-05 ENCOUNTER — Encounter: Payer: Self-pay | Admitting: Sports Medicine

## 2017-09-05 ENCOUNTER — Ambulatory Visit: Payer: BLUE CROSS/BLUE SHIELD | Admitting: Sports Medicine

## 2017-09-05 DIAGNOSIS — I10 Essential (primary) hypertension: Secondary | ICD-10-CM

## 2017-09-05 NOTE — Progress Notes (Signed)
  Subjective:    CC: Check blood pressure  HPI: As Richard Mooney continues to lose weight after his gastric sleeve he is coming down off of his blood pressure medicines, at the last visit we discontinued hydralazine, today blood pressure is well controlled but he is a bit bradycardic, he is agreeable to drop his carvedilol down as well.  I reviewed the past medical history, family history, social history, surgical history, and allergies today and no changes were needed.  Please see the problem list section below in epic for further details.  Past Medical History: Past Medical History:  Diagnosis Date  . Diabetes mellitus without complication (HCC)   . GERD (gastroesophageal reflux disease)   . Hypertension    Past Surgical History: Past Surgical History:  Procedure Laterality Date  . LAPAROSCOPIC GASTRIC SLEEVE RESECTION N/A 07/16/2017   Procedure: LAPAROSCOPIC GASTRIC SLEEVE RESECTION WITH UPPER ENDO;  Surgeon: Gaynelle AduWilson, Eric, MD;  Location: [facility];  Service: General;  Laterality: N/A;   Social History: Social History   Socioeconomic History  . Marital status: Single    Spouse name: None  . Number of children: None  . Years of education: None  . Highest education level: None  Social Needs  . Financial resource strain: None  . Food insecurity - worry: None  . Food insecurity - inability: None  . Transportation needs - medical: None  . Transportation needs - non-medical: None  Occupational History  . None  Tobacco Use  . Smoking status: Never Smoker  . Smokeless tobacco: Never Used  Substance and Sexual Activity  . Alcohol use: No  . Drug use: No  . Sexual activity: Yes  Other Topics Concern  . None  Social History Narrative  . None   Family History: Family History  Problem Relation Age of Onset  . Hyperlipidemia Mother   . Hypertension Mother    Allergies: No Known Allergies Medications: See med rec.  Review of Systems: No fevers, chills, night sweats, weight loss,  chest pain, or shortness of breath.   Objective:    General: Well Developed, well nourished, and in no acute distress.  Neuro: Alert and oriented x3, extra-ocular muscles intact, sensation grossly intact.  HEENT: Normocephalic, atraumatic, pupils equal round reactive to light, neck supple, no masses, no lymphadenopathy, thyroid nonpalpable.  Skin: Warm and dry, no rashes. Cardiac: Slightly bradycardic, regular rhythm, no murmurs rubs or gallops, no lower extremity edema.  Respiratory: Clear to auscultation bilaterally. Not using accessory muscles, speaking in full sentences.  Impression and Recommendations:    Essential hypertension, benign Blood pressure is well controlled, no changes for now. Continue amlodipine, carvedilol, Edarbyclor, we discontinued hydralazine at the last visit. Slight bradycardia, decreasing carvedilol to (one half tab) 12.5 mg twice a day rather than 25 (full tab). As he continues to lose weight after his gastric sleeve his blood pressure is improving and we are dropping him down off of his medications. Recheck heart rate and blood pressure in 1 month.  I spent 25 minutes with this patient, greater than 50% was face-to-face time counseling regarding the above diagnoses ___________________________________________ Ihor Austinhomas J. Benjamin Stainhekkekandam, M.D., ABFM., CAQSM. Primary Care and Sports Medicine [place] [facility]  Adjunct Instructor of Family Medicine  [employer]

## 2017-09-05 NOTE — Assessment & Plan Note (Addendum)
Blood pressure is well controlled, no changes for now. Continue amlodipine, carvedilol, Edarbyclor, we discontinued hydralazine at the last visit. Slight bradycardia, decreasing carvedilol to (one half tab) 12.5 mg twice a day rather than 25 (full tab). As he continues to lose weight after his gastric sleeve his blood pressure is improving and we are dropping him down off of his medications. Recheck heart rate and blood pressure in 1 month.

## 2017-09-12 ENCOUNTER — Ambulatory Visit (INDEPENDENT_AMBULATORY_CARE_PROVIDER_SITE_OTHER): Payer: BLUE CROSS/BLUE SHIELD | Admitting: Sports Medicine

## 2017-09-12 ENCOUNTER — Encounter: Payer: Self-pay | Admitting: Sports Medicine

## 2017-09-12 NOTE — Progress Notes (Signed)
  Subjective:    CC: Follow-up  HPI: This is a pleasant 32 year old male, who returns post sleeve gastrectomy, doing extremely well, he just needs some FMLA paperwork and short-term disability paperwork filled out for his job.  I reviewed the past medical history, family history, social history, surgical history, and allergies today and no changes were needed.  Please see the problem list section below in epic for further details.  Past Medical History: Past Medical History:  Diagnosis Date  . Diabetes mellitus without complication (HCC)   . GERD (gastroesophageal reflux disease)   . Hypertension    Past Surgical History: Past Surgical History:  Procedure Laterality Date  . LAPAROSCOPIC GASTRIC SLEEVE RESECTION N/A 07/16/2017   Procedure: LAPAROSCOPIC GASTRIC SLEEVE RESECTION WITH UPPER ENDO;  Surgeon: Gaynelle AduWilson, Eric, MD;  Location: [facility];  Service: General;  Laterality: N/A;   Social History: Social History   Socioeconomic History  . Marital status: Single    Spouse name: None  . Number of children: None  . Years of education: None  . Highest education level: None  Social Needs  . Financial resource strain: None  . Food insecurity - worry: None  . Food insecurity - inability: None  . Transportation needs - medical: None  . Transportation needs - non-medical: None  Occupational History  . None  Tobacco Use  . Smoking status: Never Smoker  . Smokeless tobacco: Never Used  Substance and Sexual Activity  . Alcohol use: No  . Drug use: No  . Sexual activity: Yes  Other Topics Concern  . None  Social History Narrative  . None   Family History: Family History  Problem Relation Age of Onset  . Hyperlipidemia Mother   . Hypertension Mother    Allergies: No Known Allergies Medications: See med rec.  Review of Systems: No fevers, chills, night sweats, weight loss, chest pain, or shortness of breath.   Objective:    General: Well Developed, well nourished, and in  no acute distress.  Neuro: Alert and oriented x3, extra-ocular muscles intact, sensation grossly intact.  HEENT: Normocephalic, atraumatic, pupils equal round reactive to light, neck supple, no masses, no lymphadenopathy, thyroid nonpalpable.  Skin: Warm and dry, no rashes. Cardiac: Regular rate and rhythm, no murmurs rubs or gallops, no lower extremity edema.  Respiratory: Clear to auscultation bilaterally. Not using accessory muscles, speaking in full sentences.  Impression and Recommendations:    Morbid obesity (HCC) Short-term disability paperwork filled out.  I spent 25 minutes with this patient, greater than 50% was face-to-face time counseling regarding the above diagnoses ___________________________________________ Ihor Austinhomas J. Benjamin Stainhekkekandam, M.D., ABFM., CAQSM. Primary Care and Sports Medicine  [facility]  Adjunct Instructor of Family Medicine  [employer]

## 2017-09-12 NOTE — Assessment & Plan Note (Signed)
Short term disability paperwork filled out.

## 2017-09-17 ENCOUNTER — Encounter: Payer: BLUE CROSS/BLUE SHIELD | Attending: General Surgery | Admitting: Registered"

## 2017-09-17 ENCOUNTER — Encounter: Payer: Self-pay | Admitting: Registered"

## 2017-09-17 DIAGNOSIS — E119 Type 2 diabetes mellitus without complications: Secondary | ICD-10-CM | POA: Diagnosis not present

## 2017-09-17 DIAGNOSIS — Z713 Dietary counseling and surveillance: Secondary | ICD-10-CM | POA: Insufficient documentation

## 2017-09-17 DIAGNOSIS — Z6841 Body Mass Index (BMI) 40.0 and over, adult: Secondary | ICD-10-CM | POA: Diagnosis not present

## 2017-09-17 NOTE — Progress Notes (Signed)
Follow-up visit: 8 Weeks Post-Operative Sleeve Gastrectomy Surgery  Medical Nutrition Therapy:  Appt start time: 11:50 end time:  12:40.  Primary concerns today: Post-operative Bariatric Surgery Nutrition Management.  Non scale victories: no longer using CPAP, less snoring, reduced medications   Surgery date: 07/15/2017 Surgery type: Sleeve [facility] weight at [facility]: 332 Weight today: 296.3 Weight change: N/A due to pt arrived too late for his wt to be taken at previous visit Total weight lost: 35.7 lbs Weight loss goal: reduce medications, live longer, improve health and quality of life  TANITA  BODY COMP RESULTS  08/01/2017   BMI (kg/m^2)    Fat Mass (lbs)    Fat Free Mass (lbs)    Total Body Water (lbs)    Pt states chicken makes him feel heavy. Pt states vegan philly steak and cheese was light on his stomach. Pt states he enjoys figuring out what works and does not work for him. Pt states he does not eat like he should because sleep schedule s off. Pt states he does not return to work until March. Pt states he does not feel hungry and sometimes can go 5 hours without eating. Pt states he has never been able to eat 3 meals a day. Pt is not consistently meeting protein or fluid goals.   Preferred Learning Style:   No preference indicated   Learning Readiness:   Ready  Change in progress  24-hr recall: B (AM): skips Snk (AM): none  L (PM): 5-6 grilled chicken bites w/ cheese (21g) Snk (PM): none  D (PM): 6 oz salmon cuts (42g) Snk (PM): none  Fluid intake: country time lemonade (8 oz), water (8-10 oz); ~50 oz Estimated total protein intake: ~63 grams  Medications: See list Supplementation: [facility] + 4 calcium supplements (~1000 mg/day)  CBG monitoring: has not checked in a month; Dec numbers averaged 115 Average CBG per patient: N/A Last patient reported A1c: 7.6  Using straws: no Drinking while eating: sometimes Having you been chewing well:  yes Chewing/swallowing difficulties: no Changes in vision: no Changes to mood/headaches: no Hair loss/Changes to skin/Changes to nails: no, no, no Any difficulty focusing or concentrating: no Sweating: no Dizziness/Lightheaded: no Palpitations: no  Carbonated beverages: no N/V/D/C/GAS: no, no, no, no, no Abdominal Pain: no Dumping syndrome: no Last Lap-Band fill: N/A  Recent physical activity:  ADL's  Progress Towards Goal(s):  In progress.  Handouts given during visit include:  Phase 3B: High protein + NS vegetables   Nutritional Diagnosis:  Inadequate fluid intake As related to bariatric surgery post-op recommendations.  As evidenced by pt report of less than 64 ounces of fluid daily. [id number] Inadequate protein intake As related to bariatric surgery post-op recommendations.  As evidenced by pt report of less than 80 grams of protein daily.    Intervention:  Nutrition education and counseling. Pt was educated and counseled on the importance of meeting protein and fluid goals on a daily basis and how to create a routine with eating. Goals:  Follow Phase 3B: High Protein + Non-Starchy Vegetables  Eat 3-6 small meals/snacks, every 3-5 hrs  Increase lean protein foods to meet 80g goal  Increase fluid intake to 64oz +  Avoid drinking 15 minutes before, during and 30 minutes after eating  Aim for >30 min of physical activity daily  - Sample day when needing to be at work at 5am:    Breakfast at 4:30am [employer] 7-9 am greek yogurt  Lunch 11-2pm Tuna pack    Snack 5pm walnuts    Dinner 7-8pm Salmon cuts  - Take calcium supplements 3 times a day. Try TUMS 750 mg 3 times a day.   Teaching Method Utilized:  Visual Auditory Hands on  Barriers to learning/adherence to lifestyle change: contemplative stage of change  Demonstrated degree of understanding via:  Teach Back   Monitoring/Evaluation:  Dietary intake, exercise, lap band fills, and body  weight. Follow up in 2 months for 4 month post-op visit.

## 2017-09-17 NOTE — Patient Instructions (Addendum)
Goals:  Follow Phase 3B: High Protein + Non-Starchy Vegetables  Eat 3-6 small meals/snacks, every 3-5 hrs  Increase lean protein foods to meet 80g goal  Increase fluid intake to 64oz +  Avoid drinking 15 minutes before, during and 30 minutes after eating  Aim for >30 min of physical activity daily  - Sample day when needing to be at work at 5am:    Breakfast at 4:30am [employer] breakfast    Snack 7-9 am greek yogurt    Lunch 11-2pm Tuna pack    Snack 5pm walnuts    Dinner 7-8pm Salmon cuts   - Take calcium supplements 3 times a day. Try TUMS 750 mg 3 times a day.

## 2017-09-18 DIAGNOSIS — L700 Acne vulgaris: Secondary | ICD-10-CM | POA: Diagnosis not present

## 2017-09-18 DIAGNOSIS — L219 Seborrheic dermatitis, unspecified: Secondary | ICD-10-CM | POA: Diagnosis not present

## 2017-09-18 DIAGNOSIS — L819 Disorder of pigmentation, unspecified: Secondary | ICD-10-CM | POA: Diagnosis not present

## 2017-09-18 DIAGNOSIS — L03031 Cellulitis of right toe: Secondary | ICD-10-CM | POA: Diagnosis not present

## 2017-09-18 DIAGNOSIS — B351 Tinea unguium: Secondary | ICD-10-CM | POA: Diagnosis not present

## 2017-10-03 ENCOUNTER — Encounter: Payer: Self-pay | Admitting: Sports Medicine

## 2017-10-03 ENCOUNTER — Ambulatory Visit (INDEPENDENT_AMBULATORY_CARE_PROVIDER_SITE_OTHER): Payer: BLUE CROSS/BLUE SHIELD | Admitting: Sports Medicine

## 2017-10-03 DIAGNOSIS — I10 Essential (primary) hypertension: Secondary | ICD-10-CM

## 2017-10-03 NOTE — Progress Notes (Signed)
  Subjective:    CC: Follow-up  HPI: Hypertension: Moderately well controlled now, he is several months post sleeve gastrectomy, over 50-60 pounds down.  I reviewed the past medical history, family history, social history, surgical history, and allergies today and no changes were needed.  Please see the problem list section below in epic for further details.  Past Medical History: Past Medical History:  Diagnosis Date  . Diabetes mellitus without complication (HCC)   . GERD (gastroesophageal reflux disease)   . Hypertension    Past Surgical History: Past Surgical History:  Procedure Laterality Date  . LAPAROSCOPIC GASTRIC SLEEVE RESECTION N/A 07/16/2017   Procedure: LAPAROSCOPIC GASTRIC SLEEVE RESECTION WITH UPPER ENDO;  Surgeon: Gaynelle AduWilson, Eric, MD;  Location: [facility];  Service: General;  Laterality: N/A;   Social History: Social History   Socioeconomic History  . Marital status: Single    Spouse name: None  . Number of children: None  . Years of education: None  . Highest education level: None  Social Needs  . Financial resource strain: None  . Food insecurity - worry: None  . Food insecurity - inability: None  . Transportation needs - medical: None  . Transportation needs - non-medical: None  Occupational History  . None  Tobacco Use  . Smoking status: Never Smoker  . Smokeless tobacco: Never Used  Substance and Sexual Activity  . Alcohol use: No  . Drug use: No  . Sexual activity: Yes  Other Topics Concern  . None  Social History Narrative  . None   Family History: Family History  Problem Relation Age of Onset  . Hyperlipidemia Mother   . Hypertension Mother    Allergies: No Known Allergies Medications: See med rec.  Review of Systems: No fevers, chills, night sweats, weight loss, chest pain, or shortness of breath.   Objective:    General: Well Developed, well nourished, and in no acute distress.  Neuro: Alert and oriented x3, extra-ocular muscles  intact, sensation grossly intact.  HEENT: Normocephalic, atraumatic, pupils equal round reactive to light, neck supple, no masses, no lymphadenopathy, thyroid nonpalpable.  Skin: Warm and dry, no rashes. Cardiac: Regular rate and rhythm, no murmurs rubs or gallops, no lower extremity edema.  Respiratory: Clear to auscultation bilaterally. Not using accessory muscles, speaking in full sentences.  Impression and Recommendations:    Essential hypertension, benign Controlled, as he continues to lose weight after his sleeve gastrectomy his blood pressure will continue to become better controlled so I am not changing his medicines around at this moment. Return to see me in 6 months. ___________________________________________ Ihor Austinhomas J. Benjamin Stainhekkekandam, M.D., ABFM., CAQSM. Primary Care and Sports Medicine [place] [facility]  Adjunct Instructor of Family Medicine  [employer]

## 2017-10-03 NOTE — Assessment & Plan Note (Signed)
Controlled, as he continues to lose weight after his sleeve gastrectomy his blood pressure will continue to become better controlled so I am not changing his medicines around at this moment. Return to see me in 6 months.

## 2018-01-13 IMAGING — RF DG UGI W/ KUB
10 series · 13 of 13 positions shown · non-contrast
Comparison: None.

CLINICAL DATA: Morbid obesity.  Preoperative exam.

EXAM:
UPPER GI SERIES WITH KUB
TECHNIQUE: After obtaining a scout radiograph a routine upper GI series was
performed using thin density barium.
FLUOROSCOPY TIME:  Fluoroscopy Time:  1 minutes 24 seconds
Radiation Exposure Index (if provided by the fluoroscopic device):
76.7 mGy

[Series 1: t abdomen supine · 0.15mm/px · 1 of 1 slices shown]
[im 1/1]
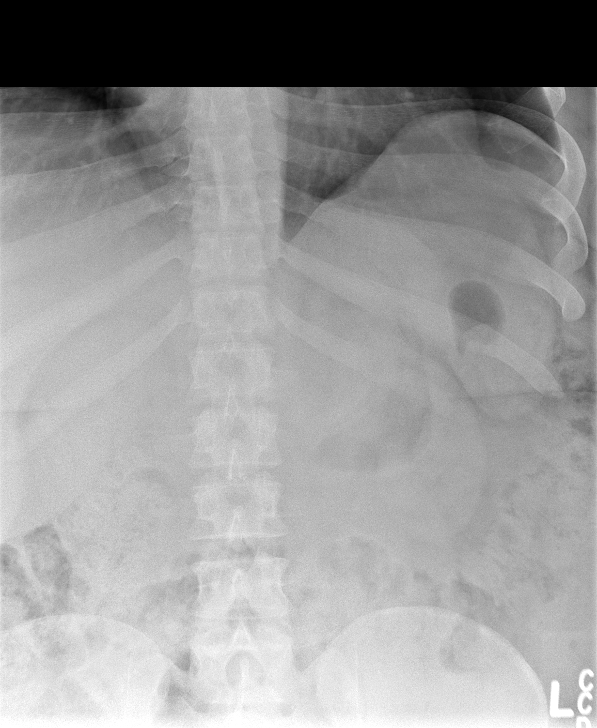

[Series 2: cp_standard · 0.53mm/px · 4 of 88 frames shown]
[frame 5/88]
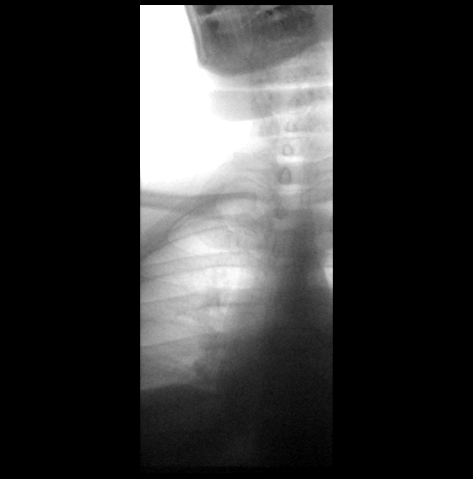
[frame 14/88]
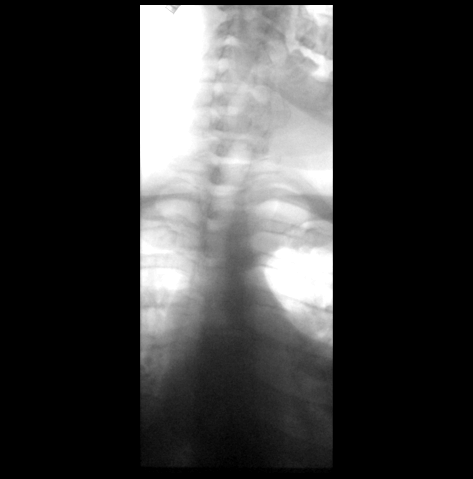
[frame 45/88]
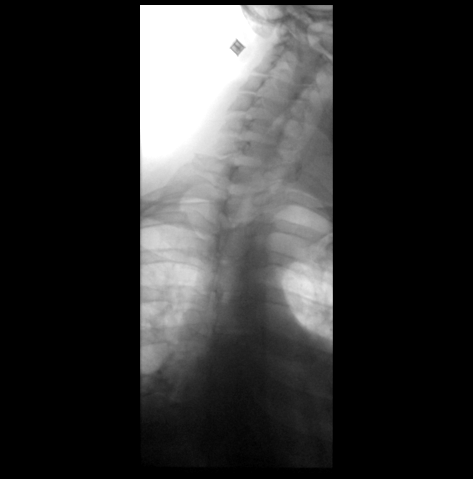
[frame 75/88]
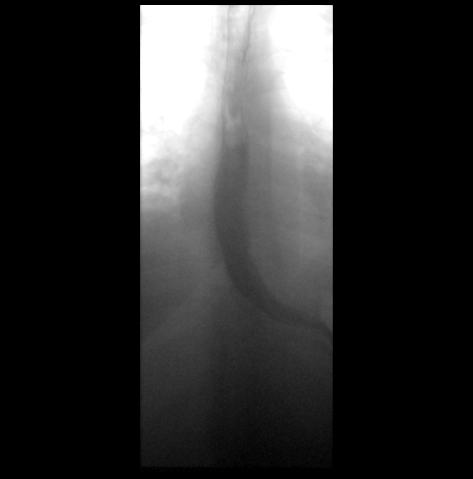

[Series 3: fluoro_barium 2fps_bw · 0.18mm/px · 1 of 1 slices shown (1 of 8)]
[im 1/1]
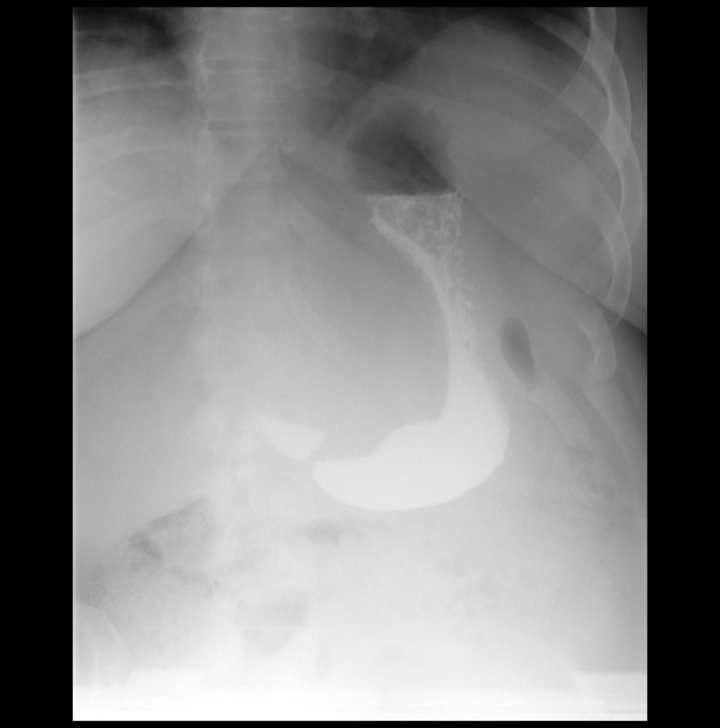

[Series 4: fluoro_barium 2fps_bw · 0.20mm/px · 1 of 1 slices shown (2 of 8)]
[im 1/1]
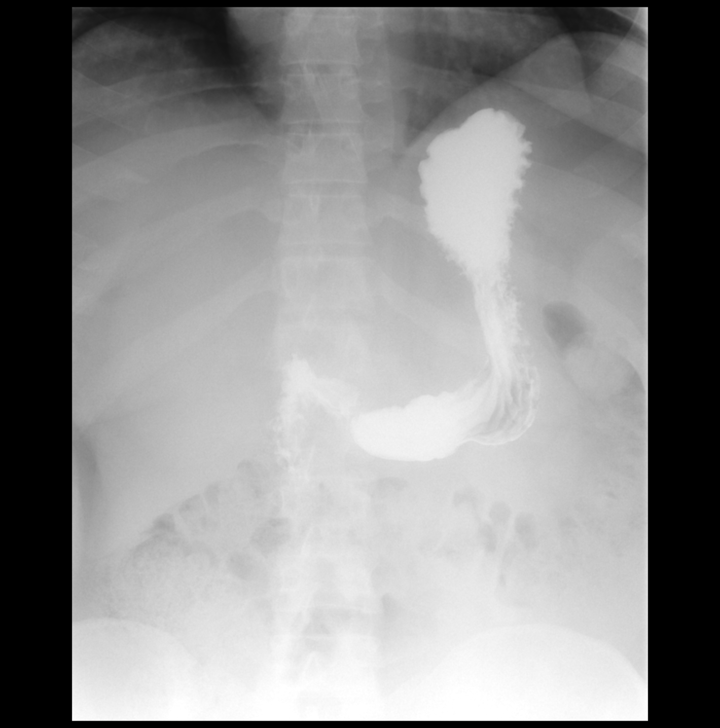

[Series 5: fluoro_barium 2fps_bw · 0.19mm/px · 1 of 1 slices shown (3 of 8)]
[im 1/1]
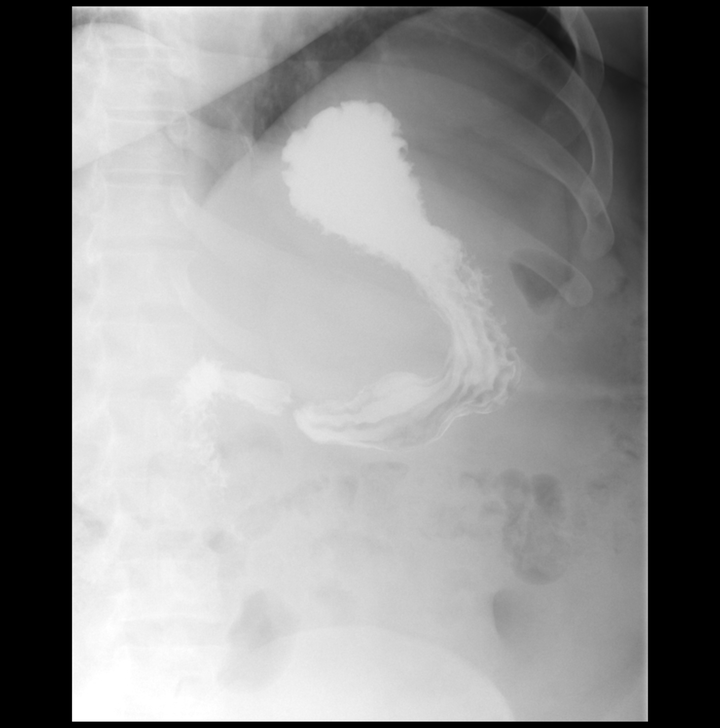

[Series 6: fluoro_barium 2fps_bw · 0.19mm/px · 1 of 1 slices shown (4 of 8)]
[im 1/1]
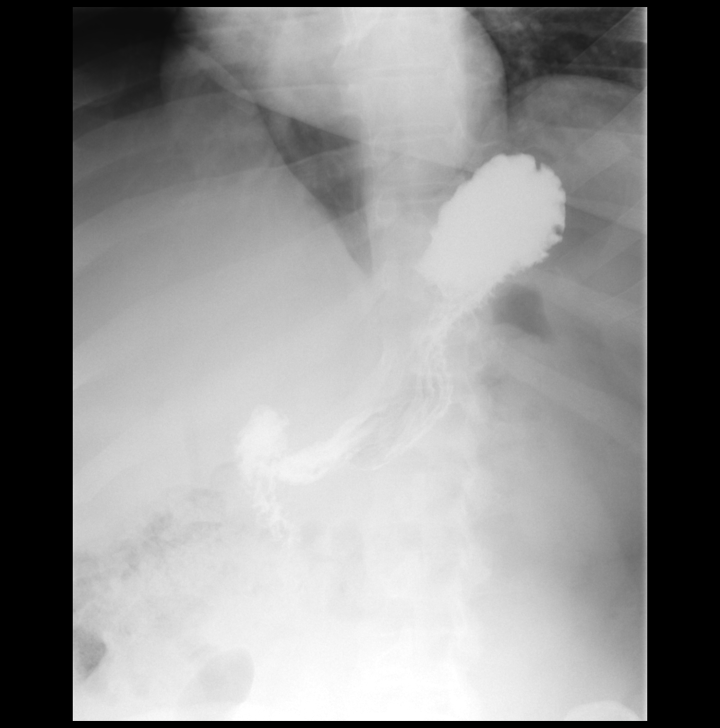

[Series 7: fluoro_barium 2fps_bw · 0.19mm/px · 1 of 1 slices shown (5 of 8)]
[im 1/1]
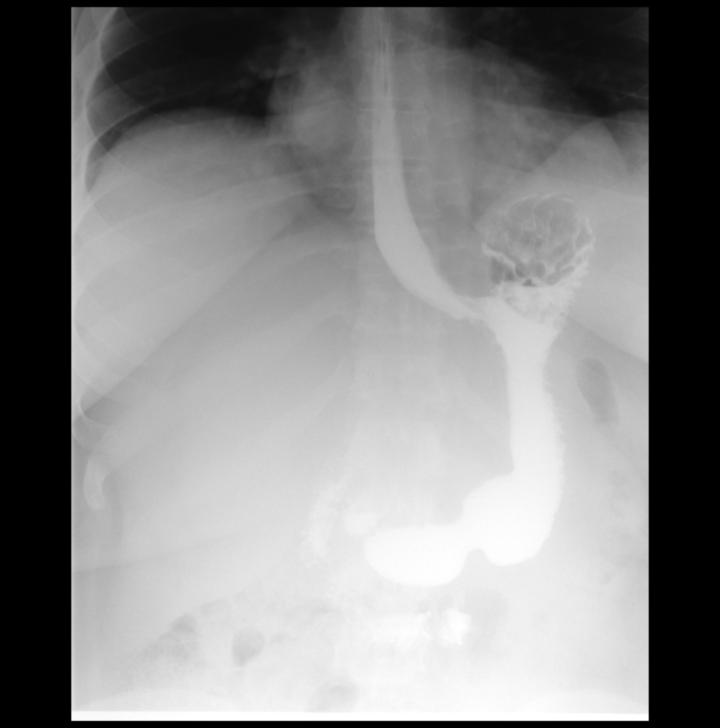

[Series 8: fluoro_barium 2fps_bw · 0.19mm/px · 1 of 1 slices shown (6 of 8)]
[im 1/1]
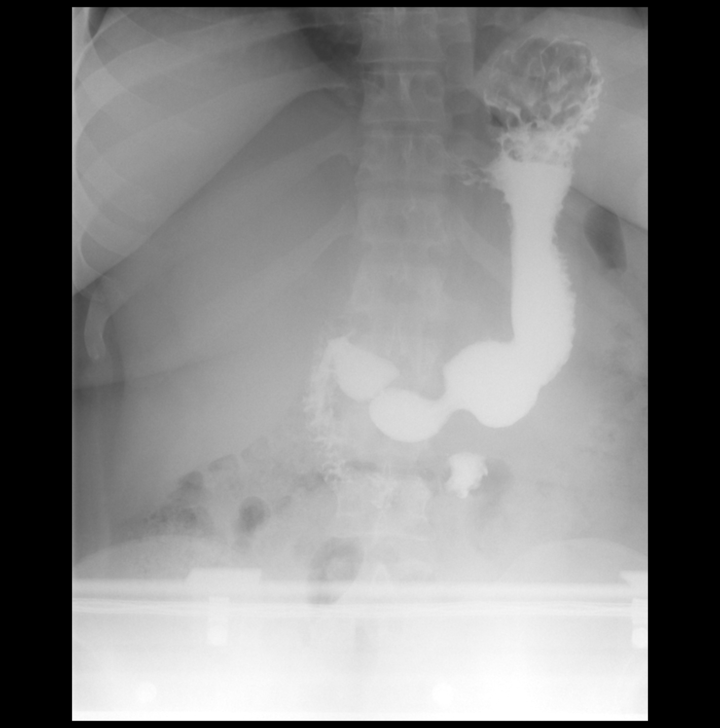

[Series 9: fluoro_barium 2fps_bw · 0.19mm/px · 1 of 1 slices shown (7 of 8)]
[im 1/1]
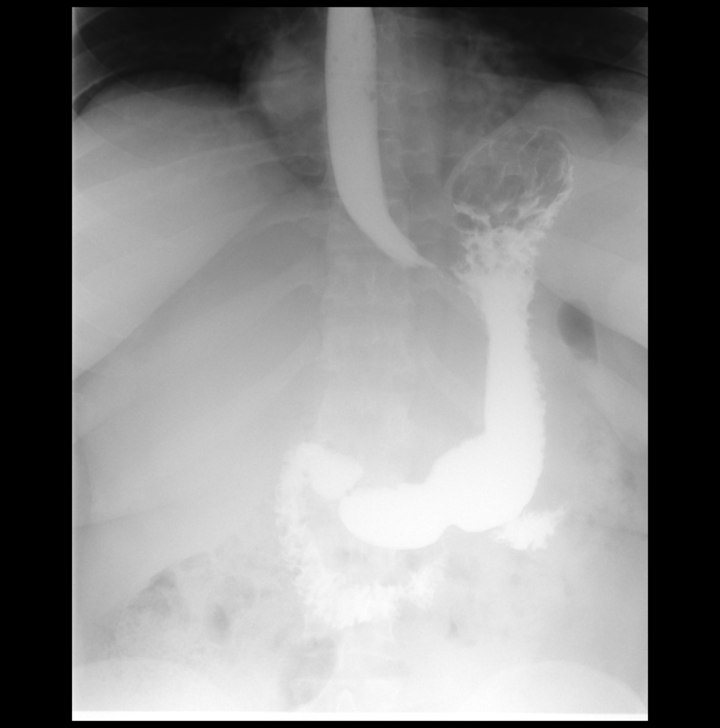

[Series 10: fluoro_barium 2fps_bw · 0.19mm/px · 1 of 1 slices shown (8 of 8)]
[im 1/1]
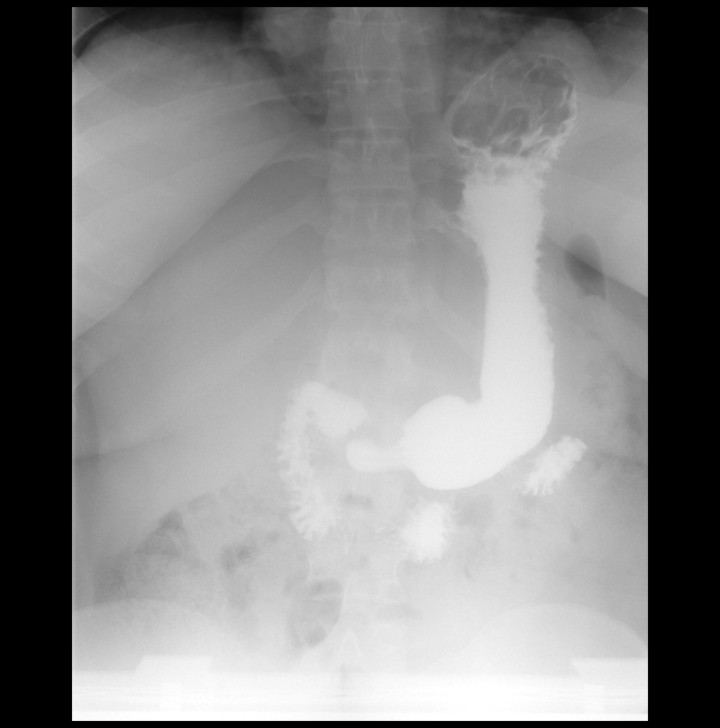

[13 of 13 positions shown; findings below may reference images not displayed]

FINDINGS: The KUB appears normal. Normal bowel gas pattern. No abnormal
abdominal calcifications.

The mucosa and motility of the esophagus are normal. No evidence of
hiatal hernia. The fundus, body, and antrum of the stomach are
normal. Pylorus and duodenal bulb and duodenal C-loop are normal.
IMPRESSION: Normal upper GI.

## 2018-01-21 DIAGNOSIS — L089 Local infection of the skin and subcutaneous tissue, unspecified: Secondary | ICD-10-CM | POA: Diagnosis not present

## 2018-01-21 DIAGNOSIS — D485 Neoplasm of uncertain behavior of skin: Secondary | ICD-10-CM | POA: Diagnosis not present

## 2018-01-21 DIAGNOSIS — L309 Dermatitis, unspecified: Secondary | ICD-10-CM | POA: Diagnosis not present

## 2018-04-01 ENCOUNTER — Ambulatory Visit: Payer: Self-pay | Admitting: Sports Medicine

## 2018-05-26 ENCOUNTER — Other Ambulatory Visit: Payer: Self-pay | Admitting: Sports Medicine

## 2018-05-26 DIAGNOSIS — I10 Essential (primary) hypertension: Secondary | ICD-10-CM

## 2018-07-11 ENCOUNTER — Other Ambulatory Visit: Payer: Self-pay | Admitting: Sports Medicine

## 2018-07-11 DIAGNOSIS — I10 Essential (primary) hypertension: Secondary | ICD-10-CM

## 2018-09-15 ENCOUNTER — Encounter: Payer: Self-pay | Admitting: Sports Medicine

## 2018-09-17 ENCOUNTER — Other Ambulatory Visit: Payer: Self-pay | Admitting: Sports Medicine

## 2018-09-17 DIAGNOSIS — I10 Essential (primary) hypertension: Secondary | ICD-10-CM

## 2018-09-17 MED ORDER — AZILSARTAN-CHLORTHALIDONE 40-25 MG PO TABS: 1.0000 | ORAL_TABLET | Freq: Every day | ORAL | 3 refills | Status: DC

## 2018-09-17 MED ORDER — CARVEDILOL 25 MG PO TABS: 25.0000 mg | ORAL_TABLET | Freq: Two times a day (BID) | ORAL | 3 refills | Status: DC

## 2018-09-17 MED ORDER — AMLODIPINE BESYLATE 10 MG PO TABS: 10.0000 mg | ORAL_TABLET | Freq: Every day | ORAL | 3 refills | Status: DC

## 2019-03-23 ENCOUNTER — Encounter (HOSPITAL_COMMUNITY): Payer: Self-pay

## 2019-11-06 ENCOUNTER — Other Ambulatory Visit: Payer: Self-pay | Admitting: Sports Medicine

## 2019-11-06 DIAGNOSIS — I10 Essential (primary) hypertension: Secondary | ICD-10-CM

## 2019-12-14 NOTE — Telephone Encounter (Signed)
Left patient a voicemail with information below. Let patient know to call us back to schedule an appointment. 

## 2019-12-14 NOTE — Telephone Encounter (Signed)
Lets just have him get some blood pressure readings at home and do a virtual visit with me.

## 2020-01-12 ENCOUNTER — Ambulatory Visit (INDEPENDENT_AMBULATORY_CARE_PROVIDER_SITE_OTHER): Payer: BC Managed Care – PPO | Admitting: Sports Medicine

## 2020-01-12 ENCOUNTER — Other Ambulatory Visit: Payer: Self-pay

## 2020-01-12 VITALS — BP 201/111 | HR 70 | Ht 68.0 in | Wt 327.0 lb

## 2020-01-12 DIAGNOSIS — I10 Essential (primary) hypertension: Secondary | ICD-10-CM

## 2020-01-12 DIAGNOSIS — Z0000 Encounter for general adult medical examination without abnormal findings: Secondary | ICD-10-CM | POA: Diagnosis not present

## 2020-01-12 DIAGNOSIS — E781 Pure hyperglyceridemia: Secondary | ICD-10-CM

## 2020-01-12 DIAGNOSIS — E119 Type 2 diabetes mellitus without complications: Secondary | ICD-10-CM

## 2020-01-12 MED ORDER — EDARBYCLOR 40-25 MG PO TABS: 1.0000 | ORAL_TABLET | Freq: Every day | ORAL | 3 refills | Status: DC

## 2020-01-12 MED ORDER — AMLODIPINE BESYLATE 10 MG PO TABS: 10.0000 mg | ORAL_TABLET | Freq: Every day | ORAL | 3 refills | Status: DC

## 2020-01-12 MED ORDER — CARVEDILOL 25 MG PO TABS: 25.0000 mg | ORAL_TABLET | Freq: Two times a day (BID) | ORAL | 3 refills | Status: DC

## 2020-01-12 NOTE — Assessment & Plan Note (Signed)
Rechecking A1c, referral for eye exam.

## 2020-01-12 NOTE — Assessment & Plan Note (Signed)
Blood pressure is over 200 systolic now, he will restart his medications, ran out of his blood pressure medication. I like him to come back in 2 weeks for a nurse visit blood pressure check. No symptoms or signs of end-stage organ damage.

## 2020-01-12 NOTE — Assessment & Plan Note (Signed)
Rechecking lipids. 

## 2020-01-12 NOTE — Progress Notes (Signed)
Subjective:    CC: Annual Physical Exam  HPI:  This patient is here for their annual physical  I reviewed the past medical history, family history, social history, surgical history, and allergies today and no changes were needed.  Please see the problem list section below in epic for further details.  Past Medical History: Past Medical History:  Diagnosis Date  . Diabetes mellitus without complication ([place])   . GERD (gastroesophageal reflux disease)   . Hypertension    Past Surgical History: Past Surgical History:  Procedure Laterality Date  . LAPAROSCOPIC GASTRIC SLEEVE RESECTION N/A 07/16/2017   Procedure: LAPAROSCOPIC GASTRIC SLEEVE RESECTION WITH UPPER ENDO;  Surgeon: Greer Pickerel, MD;  Location: [facility];  Service: General;  Laterality: N/A;   Social History: Social History   Socioeconomic History  . Marital status: Single    Spouse name: Not on file  . Number of children: Not on file  . Years of education: Not on file  . Highest education level: Not on file  Occupational History  . Not on file  Tobacco Use  . Smoking status: Never Smoker  . Smokeless tobacco: Never Used  Substance and Sexual Activity  . Alcohol use: No  . Drug use: No  . Sexual activity: Yes  Other Topics Concern  . Not on file  Social History Narrative  . Not on file   Social Determinants of Health   Financial Resource Strain:   . Difficulty of Paying Living Expenses:   Food Insecurity:   . Worried About Charity fundraiser in the Last Year:   . Arboriculturist in the Last Year:   Transportation Needs:   . Film/video editor (Medical):   Marland Kitchen Lack of Transportation (Non-Medical):   Physical Activity:   . Days of Exercise per Week:   . Minutes of Exercise per Session:   Stress:   . Feeling of Stress :   Social Connections:   . Frequency of Communication with Friends and Family:   . Frequency of Social Gatherings with Friends and Family:   . Attends Religious Services:   . Active  Member of Clubs or Organizations:   . Attends Archivist Meetings:   Marland Kitchen Marital Status:    Family History: Family History  Problem Relation Age of Onset  . Hyperlipidemia Mother   . Hypertension Mother    Allergies: No Known Allergies Medications: See med rec.  Review of Systems: No headache, visual changes, nausea, vomiting, diarrhea, constipation, dizziness, abdominal pain, skin rash, fevers, chills, night sweats, swollen lymph nodes, weight loss, chest pain, body aches, joint swelling, muscle aches, shortness of breath, mood changes, visual or auditory hallucinations.  Objective:    General: Well Developed, well nourished, and in no acute distress.  Neuro: Alert and oriented x3, extra-ocular muscles intact, sensation grossly intact. Cranial nerves II through XII are intact, motor, sensory, and coordinative functions are all intact. HEENT: Normocephalic, atraumatic, pupils equal round reactive to light, neck supple, no masses, no lymphadenopathy, thyroid nonpalpable. Oropharynx, nasopharynx, external ear canals are unremarkable. Skin: Warm and dry, no rashes noted.  Cardiac: Regular rate and rhythm, no murmurs rubs or gallops.  2+ lower extremity pitting edema Respiratory: Clear to auscultation bilaterally. Not using accessory muscles, speaking in full sentences.  Abdominal: Soft, nontender, nondistended, positive bowel sounds, no masses, no organomegaly.  Musculoskeletal: Shoulder, elbow, wrist, hip, knee, ankle stable, and with full range of motion.  Impression and Recommendations:    The patient was  counselled, risk factors were discussed, anticipatory guidance given.  Diabetes mellitus, type 2 (HCC) Rechecking A1c, referral for eye exam.  Essential hypertension, benign Blood pressure is over 200 systolic now, he will restart his medications, ran out of his blood pressure medication. I like him to come back in 2 weeks for a nurse visit blood pressure check. No  symptoms or signs of end-stage organ damage.  Hypertriglyceridemia Rechecking lipids  Morbid obesity (HCC) He is post sleeve gastrectomy, gained some weight over the past year with the pandemic. He is back in his regular job and plans to lose a significant amount.  Annual physical exam Routine physical as above.    ___________________________________________ Ihor Austin. Benjamin Stain, M.D., ABFM., CAQSM. Primary Care and Sports Medicine [facility]  Adjunct Professor of Family Medicine  [employer]

## 2020-01-12 NOTE — Assessment & Plan Note (Signed)
He is post sleeve gastrectomy, gained some weight over the past year with the pandemic. He is back in his regular job and plans to lose a significant amount.

## 2020-01-12 NOTE — Assessment & Plan Note (Signed)
Routine physical as above. 

## 2020-01-27 ENCOUNTER — Ambulatory Visit (INDEPENDENT_AMBULATORY_CARE_PROVIDER_SITE_OTHER): Payer: BC Managed Care – PPO | Admitting: Sports Medicine

## 2020-01-27 DIAGNOSIS — I10 Essential (primary) hypertension: Secondary | ICD-10-CM | POA: Diagnosis not present

## 2020-01-27 DIAGNOSIS — E119 Type 2 diabetes mellitus without complications: Secondary | ICD-10-CM | POA: Diagnosis not present

## 2020-01-27 DIAGNOSIS — E782 Mixed hyperlipidemia: Secondary | ICD-10-CM | POA: Diagnosis not present

## 2020-01-27 LAB — HM DIABETES EYE EXAM

## 2020-01-27 NOTE — Assessment & Plan Note (Signed)
Hypertension has improved considerably on medications. No change. He will follow-up with me in 3 months for diabetes check.

## 2020-01-27 NOTE — Patient Instructions (Signed)
DASH Eating Plan DASH stands for "Dietary Approaches to Stop Hypertension." The DASH eating plan is a healthy eating plan that has been shown to reduce high blood pressure (hypertension). It may also reduce your risk for type 2 diabetes, heart disease, and stroke. The DASH eating plan may also help with weight loss. What are tips for following this plan?  General guidelines  Avoid eating more than 2,300 mg (milligrams) of salt (sodium) a day. If you have hypertension, you may need to reduce your sodium intake to 1,500 mg a day.  Limit alcohol intake to no more than 1 drink a day for nonpregnant women and 2 drinks a day for men. One drink equals 12 oz of beer, 5 oz of wine, or 1 oz of hard liquor.  Work with your health care provider to maintain a healthy body weight or to lose weight. Ask what an ideal weight is for you.  Get at least 30 minutes of exercise that causes your heart to beat faster (aerobic exercise) most days of the week. Activities may include walking, swimming, or biking.  Work with your health care provider or diet and nutrition specialist (dietitian) to adjust your eating plan to your individual calorie needs. Reading food labels   Check food labels for the amount of sodium per serving. Choose foods with less than 5 percent of the Daily Value of sodium. Generally, foods with less than 300 mg of sodium per serving fit into this eating plan.  To find whole grains, look for the word "whole" as the first word in the ingredient list. Shopping  Buy products labeled as "low-sodium" or "no salt added."  Buy fresh foods. Avoid canned foods and premade or frozen meals. Cooking  Avoid adding salt when cooking. Use salt-free seasonings or herbs instead of table salt or sea salt. Check with your health care provider or pharmacist before using salt substitutes.  Do not fry foods. Cook foods using healthy methods such as baking, boiling, grilling, and broiling instead.  Cook with  heart-healthy oils, such as olive, canola, soybean, or sunflower oil. Meal planning  Eat a balanced diet that includes: ? 5 or more servings of fruits and vegetables each day. At each meal, try to fill half of your plate with fruits and vegetables. ? Up to 6-8 servings of whole grains each day. ? Less than 6 oz of lean meat, poultry, or fish each day. A 3-oz serving of meat is about the same size as a deck of cards. One egg equals 1 oz. ? 2 servings of low-fat dairy each day. ? A serving of nuts, seeds, or beans 5 times each week. ? Heart-healthy fats. Healthy fats called Omega-3 fatty acids are found in foods such as flaxseeds and coldwater fish, like sardines, salmon, and mackerel.  Limit how much you eat of the following: ? Canned or prepackaged foods. ? Food that is high in trans fat, such as fried foods. ? Food that is high in saturated fat, such as fatty meat. ? Sweets, desserts, sugary drinks, and other foods with added sugar. ? Full-fat dairy products.  Do not salt foods before eating.  Try to eat at least 2 vegetarian meals each week.  Eat more home-cooked food and less restaurant, buffet, and fast food.  When eating at a restaurant, ask that your food be prepared with less salt or no salt, if possible. What foods are recommended? The items listed may not be a complete list. Talk with your dietitian about   what dietary choices are best for you. Grains Whole-grain or whole-wheat bread. Whole-grain or whole-wheat pasta. Brown rice. Oatmeal. Quinoa. Bulgur. Whole-grain and low-sodium cereals. Pita bread. Low-fat, low-sodium crackers. Whole-wheat flour tortillas. Vegetables Fresh or frozen vegetables (raw, steamed, roasted, or grilled). Low-sodium or reduced-sodium tomato and vegetable juice. Low-sodium or reduced-sodium tomato sauce and tomato paste. Low-sodium or reduced-sodium canned vegetables. Fruits All fresh, dried, or frozen fruit. Canned fruit in natural juice (without  added sugar). Meat and other protein foods Skinless chicken or turkey. Ground chicken or turkey. Pork with fat trimmed off. Fish and seafood. Egg whites. Dried beans, peas, or lentils. Unsalted nuts, nut butters, and seeds. Unsalted canned beans. Lean cuts of beef with fat trimmed off. Low-sodium, lean deli meat. Dairy Low-fat (1%) or fat-free (skim) milk. Fat-free, low-fat, or reduced-fat cheeses. Nonfat, low-sodium ricotta or cottage cheese. Low-fat or nonfat yogurt. Low-fat, low-sodium cheese. Fats and oils Soft margarine without trans fats. Vegetable oil. Low-fat, reduced-fat, or light mayonnaise and salad dressings (reduced-sodium). Canola, safflower, olive, soybean, and sunflower oils. Avocado. Seasoning and other foods Herbs. Spices. Seasoning mixes without salt. Unsalted popcorn and pretzels. Fat-free sweets. What foods are not recommended? The items listed may not be a complete list. Talk with your dietitian about what dietary choices are best for you. Grains Baked goods made with fat, such as croissants, muffins, or some breads. Dry pasta or rice meal packs. Vegetables Creamed or fried vegetables. Vegetables in a cheese sauce. Regular canned vegetables (not low-sodium or reduced-sodium). Regular canned tomato sauce and paste (not low-sodium or reduced-sodium). Regular tomato and vegetable juice (not low-sodium or reduced-sodium). Pickles. Olives. Fruits Canned fruit in a light or heavy syrup. Fried fruit. Fruit in cream or butter sauce. Meat and other protein foods Fatty cuts of meat. Ribs. Fried meat. Bacon. Sausage. Bologna and other processed lunch meats. Salami. Fatback. Hotdogs. Bratwurst. Salted nuts and seeds. Canned beans with added salt. Canned or smoked fish. Whole eggs or egg yolks. Chicken or turkey with skin. Dairy Whole or 2% milk, cream, and half-and-half. Whole or full-fat cream cheese. Whole-fat or sweetened yogurt. Full-fat cheese. Nondairy creamers. Whipped toppings.  Processed cheese and cheese spreads. Fats and oils Butter. Stick margarine. Lard. Shortening. Ghee. Bacon fat. Tropical oils, such as coconut, palm kernel, or palm oil. Seasoning and other foods Salted popcorn and pretzels. Onion salt, garlic salt, seasoned salt, table salt, and sea salt. Worcestershire sauce. Tartar sauce. Barbecue sauce. Teriyaki sauce. Soy sauce, including reduced-sodium. Steak sauce. Canned and packaged gravies. Fish sauce. Oyster sauce. Cocktail sauce. Horseradish that you find on the shelf. Ketchup. Mustard. Meat flavorings and tenderizers. Bouillon cubes. Hot sauce and Tabasco sauce. Premade or packaged marinades. Premade or packaged taco seasonings. Relishes. Regular salad dressings. Where to find more information:  National Heart, Lung, and Blood Institute: www.nhlbi.nih.gov  American Heart Association: www.heart.org Summary  The DASH eating plan is a healthy eating plan that has been shown to reduce high blood pressure (hypertension). It may also reduce your risk for type 2 diabetes, heart disease, and stroke.  With the DASH eating plan, you should limit salt (sodium) intake to 2,300 mg a day. If you have hypertension, you may need to reduce your sodium intake to 1,500 mg a day.  When on the DASH eating plan, aim to eat more fresh fruits and vegetables, whole grains, lean proteins, low-fat dairy, and heart-healthy fats.  Work with your health care provider or diet and nutrition specialist (dietitian) to adjust your eating plan to your   individual calorie needs. This information is not intended to replace advice given to you by your health care provider. Make sure you discuss any questions you have with your health care provider. Document Revised: 07/05/2017 Document Reviewed: 07/16/2016 Elsevier Patient Education  2020 Elsevier Inc.  

## 2020-01-27 NOTE — Progress Notes (Signed)
Pt presents to clinic today for BP check. Denies any cp/sob/palpitaions/headaches/dizziness or swelling.   Taking medications as directed.

## 2020-02-03 ENCOUNTER — Encounter: Payer: Self-pay | Admitting: Sports Medicine

## 2020-02-04 DIAGNOSIS — Z0000 Encounter for general adult medical examination without abnormal findings: Secondary | ICD-10-CM | POA: Diagnosis not present

## 2020-02-04 DIAGNOSIS — E119 Type 2 diabetes mellitus without complications: Secondary | ICD-10-CM | POA: Diagnosis not present

## 2020-02-05 ENCOUNTER — Other Ambulatory Visit: Payer: Self-pay

## 2020-02-05 LAB — LIPID PANEL W/REFLEX DIRECT LDL
Cholesterol: 170 mg/dL (ref <200)
HDL: 35 mg/dL — ABNORMAL LOW (ref >=40)
LDL Cholesterol (Calc): 100 mg/dL (calc) — ABNORMAL HIGH
Non-HDL Cholesterol (Calc): 135 mg/dL (calc) — ABNORMAL HIGH (ref <130)
Total CHOL/HDL Ratio: 4.9 (calc) (ref <5.0)
Triglycerides: 233 mg/dL — ABNORMAL HIGH (ref <150)

## 2020-02-05 LAB — CBC
HCT: 40.7 % (ref 38.5–50.0)
Hemoglobin: 13.9 g/dL (ref 13.2–17.1)
MCH: 28.9 pg (ref 27.0–33.0)
MCHC: 34.2 g/dL (ref 32.0–36.0)
MCV: 84.6 fL (ref 80.0–100.0)
MPV: 11 fL (ref 7.5–12.5)
Platelets: 261 10*3/uL (ref 140–400)
RBC: 4.81 10*6/uL (ref 4.20–5.80)
RDW: 13.6 % (ref 11.0–15.0)
WBC: 7.1 10*3/uL (ref 3.8–10.8)

## 2020-02-05 LAB — HEPATITIS C ANTIBODY
Hepatitis C Ab: NONREACTIVE
SIGNAL TO CUT-OFF: 0.01 (ref <1.00)

## 2020-02-05 LAB — COMPLETE METABOLIC PANEL WITH GFR
AG Ratio: 1.3 (calc) (ref 1.0–2.5)
ALT: 11 U/L (ref 9–46)
AST: 10 U/L (ref 10–40)
Albumin: 4 g/dL (ref 3.6–5.1)
Alkaline phosphatase (APISO): 79 U/L (ref 36–130)
BUN: 18 mg/dL (ref 7–25)
CO2: 27 mmol/L (ref 20–32)
Calcium: 9.1 mg/dL (ref 8.6–10.3)
Chloride: 101 mmol/L (ref 98–110)
Creat: 1.1 mg/dL (ref 0.60–1.35)
GFR, Est African American: 100 mL/min/{1.73_m2} (ref >=60)
GFR, Est Non African American: 87 mL/min/{1.73_m2} (ref >=60)
Globulin: 3.2 g/dL (calc) (ref 1.9–3.7)
Glucose, Bld: 141 mg/dL — ABNORMAL HIGH (ref 65–99)
Potassium: 3.8 mmol/L (ref 3.5–5.3)
Sodium: 136 mmol/L (ref 135–146)
Total Bilirubin: 0.3 mg/dL (ref 0.2–1.2)
Total Protein: 7.2 g/dL (ref 6.1–8.1)

## 2020-02-05 LAB — TSH: TSH: 2.11 mIU/L (ref 0.40–4.50)

## 2020-02-05 LAB — HEMOGLOBIN A1C
Hgb A1c MFr Bld: 7 % of total Hgb — ABNORMAL HIGH (ref <5.7)
Mean Plasma Glucose: 154 (calc)
eAG (mmol/L): 8.5 (calc)

## 2020-02-05 MED ORDER — ROSUVASTATIN CALCIUM 20 MG PO TABS: 20.0000 mg | ORAL_TABLET | Freq: Every day | ORAL | 3 refills | Status: DC

## 2020-02-05 NOTE — Assessment & Plan Note (Signed)
Adding 20 mg of Crestor, recheck lipids in 2 to 3 months.

## 2020-02-05 NOTE — Addendum Note (Signed)
Addended by: Monica Becton on: 02/05/2020 12:29 PM   Modules accepted: Orders

## 2020-02-05 NOTE — Progress Notes (Signed)
Opened in error

## 2020-02-10 ENCOUNTER — Encounter (HOSPITAL_COMMUNITY): Payer: Self-pay

## 2020-04-28 ENCOUNTER — Ambulatory Visit: Payer: BC Managed Care – PPO | Admitting: Sports Medicine

## 2020-05-04 ENCOUNTER — Emergency Department
Admission: RE | Admit: 2020-05-04 | Discharge: 2020-05-04 | Disposition: A | Discharge status: Home / Self Care | Payer: BC Managed Care – PPO | Source: Ambulatory Visit

## 2020-05-04 ENCOUNTER — Other Ambulatory Visit: Payer: Self-pay

## 2020-05-04 DIAGNOSIS — Z20822 Contact with and (suspected) exposure to covid-19: Secondary | ICD-10-CM

## 2020-05-04 NOTE — ED Triage Notes (Signed)
Patient presents to Urgent Care with complaints of needing a covid test and letter for missing work earlier in the week since he had a runny nose earlier this week but has no sx at this time. Patient reports the weather always makes his nose run, needs a test so he can go back to work.

## 2020-05-04 NOTE — Discharge Instructions (Signed)

## 2020-05-08 LAB — NOVEL CORONAVIRUS, NAA

## 2020-05-09 ENCOUNTER — Telehealth: Payer: Self-pay

## 2020-05-09 ENCOUNTER — Ambulatory Visit: Payer: Self-pay

## 2020-05-09 DIAGNOSIS — Z20822 Contact with and (suspected) exposure to covid-19: Secondary | ICD-10-CM | POA: Diagnosis not present

## 2020-05-09 NOTE — Telephone Encounter (Signed)
Pt returning to be re-swabbed for covid after last test resulted as "indeterminate".

## 2020-05-09 NOTE — Telephone Encounter (Signed)
Patient re-swabbed, order requisition sent to [facility] for Covid testing. Isolation precautions discussed while test is pending.

## 2020-05-11 ENCOUNTER — Telehealth: Payer: Self-pay

## 2020-05-11 LAB — SPECIMEN STATUS REPORT

## 2020-05-11 LAB — NOVEL CORONAVIRUS, NAA

## 2020-05-11 NOTE — Telephone Encounter (Signed)
Spoke to patient regarding second COVID test resulting "indeterminate". Recommended patient be tested elsewhere and apologized for the inconvenience. Pt remains symptom free.

## 2020-05-30 ENCOUNTER — Encounter: Payer: Self-pay | Admitting: Sports Medicine

## 2020-05-30 ENCOUNTER — Ambulatory Visit: Payer: BC Managed Care – PPO | Admitting: Sports Medicine

## 2020-05-30 ENCOUNTER — Other Ambulatory Visit: Payer: Self-pay

## 2020-05-30 VITALS — BP 143/83 | HR 78 | Wt 328.0 lb

## 2020-05-30 DIAGNOSIS — E119 Type 2 diabetes mellitus without complications: Secondary | ICD-10-CM

## 2020-05-30 LAB — POCT GLYCOSYLATED HEMOGLOBIN (HGB A1C): Hemoglobin A1C: 7.8 % — AB (ref 4.0–5.6)

## 2020-05-30 MED ORDER — OZEMPIC (0.25 OR 0.5 MG/DOSE) 2 MG/1.5ML ~~LOC~~ SOPN: PEN_INJECTOR | SUBCUTANEOUS | 1 refills | Status: DC

## 2020-05-30 MED ORDER — OZEMPIC (1 MG/DOSE) 2 MG/1.5ML ~~LOC~~ SOPN: 2.0000 mg | PEN_INJECTOR | SUBCUTANEOUS | 11 refills | Status: DC

## 2020-05-30 NOTE — Progress Notes (Signed)
    Procedures performed today:    None.  Independent interpretation of notes and tests performed by another provider:   None.  Brief History, Exam, Impression, and Recommendations:    Diabetes mellitus, type 2 (HCC) This is a pleasant 34 year old male, his diabetes is currently diet controlled, he has been getting some weight unfortunately. He is also post gastric sleeve. Because of his increase in weight and his worsening diabetes though still diet controlled I am going to add Ozempic to help him with significant weight loss. He has been on Metformin in the past. He does have metabolic syndrome.    ___________________________________________ Ihor Austin. Benjamin Stain, M.D., ABFM., CAQSM. Primary Care and Sports Medicine [facility]  Adjunct Instructor of Family Medicine  [employer]

## 2020-05-30 NOTE — Assessment & Plan Note (Signed)
This is a pleasant 34 year old male, his diabetes is currently diet controlled, he has been getting some weight unfortunately. He is also post gastric sleeve. Because of his increase in weight and his worsening diabetes though still diet controlled I am going to add Ozempic to help him with significant weight loss. He has been on Metformin in the past. He does have metabolic syndrome.

## 2020-06-03 ENCOUNTER — Telehealth: Payer: Self-pay | Admitting: *Deleted

## 2020-06-03 NOTE — Telephone Encounter (Signed)
Prior auth for [employer] approved and pharmacy notified.

## 2020-06-16 ENCOUNTER — Telehealth (INDEPENDENT_AMBULATORY_CARE_PROVIDER_SITE_OTHER): Payer: BC Managed Care – PPO | Admitting: Sports Medicine

## 2020-06-16 DIAGNOSIS — K625 Hemorrhage of anus and rectum: Secondary | ICD-10-CM | POA: Diagnosis not present

## 2020-06-16 NOTE — Progress Notes (Signed)
Patient reports that 2-3 weeks ago after a BM there was some blood in the toilet.  Pain and pressure in the rectum and constipation for a week.  There was no blood on the tissue.  Does not feel anything outside the rectum.

## 2020-06-16 NOTE — Assessment & Plan Note (Addendum)
This is a pleasant 34 year old male, several days ago he had an episode of hard stools, followed by some bright red blood per rectum in the toilet and a little bit on the paper, this resolved completely, and it has not recurred. This sounds like an internal or external hemorrhoid, he will increase the fiber in his diet, keep the anus clean, avoid straining, and if this recurs we can bring him in for anoscopy. He does not have a strong family history of colon cancer.

## 2020-06-16 NOTE — Progress Notes (Signed)
   Virtual Visit via Telephone   I connected with  Richard Mooney  on 06/16/20 by telephone/telehealth and verified that I am speaking with the correct person using two identifiers.   I discussed the limitations, risks, security and privacy concerns of performing an evaluation and management service by telephone, including the higher likelihood of inaccurate diagnosis and treatment, and the availability of in person appointments.  We also discussed the likely need of an additional face to face encounter for complete and high quality delivery of care.  I also discussed with the patient that there may be a patient responsible charge related to this service. The patient expressed understanding and wishes to proceed.  Provider location is in medical facility. Patient location is at their home, different from provider location. People involved in care of the patient during this telehealth encounter were myself, my nurse/medical assistant, and my front office/scheduling team member.  Review of Systems: No fevers, chills, night sweats, weight loss, chest pain, or shortness of breath.   Objective Findings:    General: Speaking full sentences, no audible heavy breathing.  Sounds alert and appropriately interactive.    Independent interpretation of tests performed by another provider:   None.  Brief History, Exam, Impression, and Recommendations:    Bright red blood per rectum This is a pleasant 34 year old male, several days ago he had an episode of hard stools, followed by some bright red blood per rectum in the toilet and a little bit on the paper, this resolved completely, and it has not recurred. This sounds like an internal or external hemorrhoid, he will increase the fiber in his diet, keep the anus clean, avoid straining, and if this recurs we can bring him in for anoscopy. He does not have a strong family history of colon cancer.   I discussed the above assessment and treatment plan with the  patient. The patient was provided an opportunity to ask questions and all were answered. The patient agreed with the plan and demonstrated an understanding of the instructions.   The patient was advised to call back or seek an in-person evaluation if the symptoms worsen or if the condition fails to improve as anticipated.   I provided 30 minutes of face to face and non-face-to-face time during this encounter date, time was needed to gather information, review chart, records, communicate/coordinate with staff remotely, as well as complete documentation.   ___________________________________________ Ihor Austin. Benjamin Stain, M.D., ABFM., CAQSM. Primary Care and Sports Medicine [facility]  Adjunct Professor of Family Medicine  [employer]

## 2020-07-11 ENCOUNTER — Telehealth: Payer: Self-pay | Admitting: Emergency Medicine

## 2020-07-11 ENCOUNTER — Ambulatory Visit: Payer: Self-pay

## 2020-07-11 NOTE — Telephone Encounter (Signed)
Call to patient to see what type of covid test he wanted - message left

## 2020-08-30 ENCOUNTER — Ambulatory Visit (INDEPENDENT_AMBULATORY_CARE_PROVIDER_SITE_OTHER): Payer: Managed Care, Other (non HMO) | Admitting: Sports Medicine

## 2020-08-30 ENCOUNTER — Other Ambulatory Visit: Payer: Self-pay

## 2020-08-30 DIAGNOSIS — Z566 Other physical and mental strain related to work: Secondary | ICD-10-CM

## 2020-08-30 DIAGNOSIS — I10 Essential (primary) hypertension: Secondary | ICD-10-CM | POA: Diagnosis not present

## 2020-08-30 DIAGNOSIS — E119 Type 2 diabetes mellitus without complications: Secondary | ICD-10-CM

## 2020-08-30 LAB — POCT GLYCOSYLATED HEMOGLOBIN (HGB A1C): Hemoglobin A1C: 7.2 % — AB (ref 4.0–5.6)

## 2020-08-30 NOTE — Assessment & Plan Note (Signed)
Severe work-related stress, he is actually going to discontinue work at his current position, I did fill out FMLA paperwork to give him a full 3 months out. I do think this is directly related to his uncontrolled blood pressure.

## 2020-08-30 NOTE — Assessment & Plan Note (Signed)
Elevated blood pressure on current medication regimen, he has had significant amounts of stress at work, he is taking a leave of absence and is going to be using this time to find a new job at which point he thinks he will be much happier and healthier. He did bring his blood pressure readings from home which typically run in the 120s to 130s systolic.  There is likely a whitecoat component, no intervention will be done today.

## 2020-08-30 NOTE — Progress Notes (Signed)
    Procedures performed today:    None.  Independent interpretation of notes and tests performed by another provider:   None.  Brief History, Exam, Impression, and Recommendations:    Diabetes mellitus, type 2 (HCC) Post gastric sleeve, we did add Ozempic. Has been doing full dose really has not lost any weight. A1c has improved down to 7.2%. We will stay the course for now.  Essential hypertension, benign Elevated blood pressure on current medication regimen, he has had significant amounts of stress at work, he is taking a leave of absence and is going to be using this time to find a new job at which point he thinks he will be much happier and healthier. He did bring his blood pressure readings from home which typically run in the 120s to 130s systolic.  There is likely a whitecoat component, no intervention will be done today.  Work-related stress Severe work-related stress, he is actually going to discontinue work at his current position, I did fill out FMLA paperwork to give him a full 3 months out. I do think this is directly related to his uncontrolled blood pressure.  I spent 30 minutes of total time managing this patient today, this includes chart review, face to face, and non-face to face time.  ___________________________________________ Ihor Austin. Benjamin Stain, M.D., ABFM., CAQSM. Primary Care and Sports Medicine [facility]  Adjunct Instructor of Family Medicine  [employer]

## 2020-08-30 NOTE — Assessment & Plan Note (Addendum)
Post gastric sleeve, we did add Ozempic. Has been doing full dose really has not lost any weight. A1c has improved down to 7.2%. We will stay the course for now.

## 2020-09-20 ENCOUNTER — Telehealth (INDEPENDENT_AMBULATORY_CARE_PROVIDER_SITE_OTHER): Payer: Managed Care, Other (non HMO) | Admitting: Sports Medicine

## 2020-09-20 DIAGNOSIS — I10 Essential (primary) hypertension: Secondary | ICD-10-CM | POA: Diagnosis not present

## 2020-09-20 DIAGNOSIS — Z566 Other physical and mental strain related to work: Secondary | ICD-10-CM

## 2020-09-20 NOTE — Progress Notes (Signed)
   Virtual Visit via Telephone   I connected with  Richard Mooney  on 09/20/20 by telephone/telehealth and verified that I am speaking with the correct person using two identifiers.   I discussed the limitations, risks, security and privacy concerns of performing an evaluation and management service by telephone, including the higher likelihood of inaccurate diagnosis and treatment, and the availability of in person appointments.  We also discussed the likely need of an additional face to face encounter for complete and high quality delivery of care.  I also discussed with the patient that there may be a patient responsible charge related to this service. The patient expressed understanding and wishes to proceed.  Provider location is in medical facility. Patient location is at their home, different from provider location. People involved in care of the patient during this telehealth encounter were myself, my nurse/medical assistant, and my front office/scheduling team member.  Review of Systems: No fevers, chills, night sweats, weight loss, chest pain, or shortness of breath.   Objective Findings:    General: Speaking full sentences, no audible heavy breathing.  Sounds alert and appropriately interactive.    Independent interpretation of tests performed by another provider:   None.  Brief History, Exam, Impression, and Recommendations:    Work-related stress Continues to have severe work-related stress, he is now out of work and things are improving a bit. We filled out disability paperwork today. This is likely related to his uncontrolled blood pressure.  Essential hypertension, benign Blood pressure did remain somewhat elevated, he will check this over the next several days and let me know, if it continues to run over 135/80 we will augment his blood pressure regimen.   I discussed the above assessment and treatment plan with the patient. The patient was provided an opportunity to ask  questions and all were answered. The patient agreed with the plan and demonstrated an understanding of the instructions.   The patient was advised to call back or seek an in-person evaluation if the symptoms worsen or if the condition fails to improve as anticipated.   I provided 30 minutes of face to face and non-face-to-face time during this encounter date, time was needed to gather information, review chart, records, communicate/coordinate with staff remotely, as well as complete documentation.   ___________________________________________ Ihor Austin. Benjamin Stain, M.D., ABFM., CAQSM. Primary Care and Sports Medicine [facility]  Adjunct Professor of Family Medicine  [employer]

## 2020-09-20 NOTE — Assessment & Plan Note (Signed)
Blood pressure did remain somewhat elevated, he will check this over the next several days and let me know, if it continues to run over 135/80 we will augment his blood pressure regimen.

## 2020-09-20 NOTE — Assessment & Plan Note (Signed)
Continues to have severe work-related stress, he is now out of work and things are improving a bit. We filled out disability paperwork today. This is likely related to his uncontrolled blood pressure.

## 2020-09-23 ENCOUNTER — Encounter: Payer: Self-pay | Admitting: Sports Medicine

## 2021-02-09 ENCOUNTER — Encounter (HOSPITAL_COMMUNITY): Payer: Self-pay | Admitting: *Deleted

## 2021-02-27 ENCOUNTER — Other Ambulatory Visit: Payer: Self-pay | Admitting: Sports Medicine

## 2021-02-27 DIAGNOSIS — I10 Essential (primary) hypertension: Secondary | ICD-10-CM

## 2021-02-27 DIAGNOSIS — E782 Mixed hyperlipidemia: Secondary | ICD-10-CM

## 2021-03-02 ENCOUNTER — Ambulatory Visit: Payer: Managed Care, Other (non HMO) | Admitting: Sports Medicine

## 2021-05-25 ENCOUNTER — Telehealth: Payer: Self-pay

## 2021-05-25 NOTE — Telephone Encounter (Signed)
Medication: Semaglutide,0.25 or 0.5MG /DOS, (OZEMPIC, 0.25 OR 0.5 MG/DOSE,) 2 MG/1.5ML SOPN Prior authorization required Need to verify pts insurance: BCBS PA that came through [facility] does not show coverage and his chart shows [employer], which also shows no coverage.   MyChart message sent to pt asking him to send pics of the front and back of his current insurance card.

## 2021-06-15 ENCOUNTER — Ambulatory Visit (INDEPENDENT_AMBULATORY_CARE_PROVIDER_SITE_OTHER): Payer: Self-pay | Admitting: Sports Medicine

## 2021-06-15 DIAGNOSIS — I10 Essential (primary) hypertension: Secondary | ICD-10-CM

## 2021-06-15 DIAGNOSIS — E119 Type 2 diabetes mellitus without complications: Secondary | ICD-10-CM

## 2021-06-15 DIAGNOSIS — E782 Mixed hyperlipidemia: Secondary | ICD-10-CM

## 2021-06-15 NOTE — Assessment & Plan Note (Signed)
Rechecking lipids today, continue current medications

## 2021-06-15 NOTE — Assessment & Plan Note (Signed)
Continues to lose weight with semaglutide.

## 2021-06-15 NOTE — Assessment & Plan Note (Signed)
Controlled, no changes. 

## 2021-06-15 NOTE — Assessment & Plan Note (Signed)
Historically controlled, has lost some more weight, continue current medications, setting up for an eye exam, foot exam conducted today.

## 2021-06-15 NOTE — Progress Notes (Signed)
    Procedures performed today:    None.  Independent interpretation of notes and tests performed by another provider:   None.  Brief History, Exam, Impression, and Recommendations:    Diabetes mellitus, type 2 (HCC) Historically controlled, has lost some more weight, continue current medications, setting up for an eye exam, foot exam conducted today.  Mixed hyperlipidemia Rechecking lipids today, continue current medications  Morbid obesity (HCC) Continues to lose weight with semaglutide.  Essential hypertension, benign Controlled, no changes.    ___________________________________________ Ihor Austin. Benjamin Stain, M.D., ABFM., CAQSM. Primary Care and Sports Medicine [facility]  Adjunct Instructor of Family Medicine  [employer]

## 2021-06-16 LAB — CBC
HCT: 45.3 % (ref 38.5–50.0)
Hemoglobin: 15.3 g/dL (ref 13.2–17.1)
MCH: 28.7 pg (ref 27.0–33.0)
MCHC: 33.8 g/dL (ref 32.0–36.0)
MCV: 85 fL (ref 80.0–100.0)
MPV: 10.7 fL (ref 7.5–12.5)
Platelets: 270 10*3/uL (ref 140–400)
RBC: 5.33 10*6/uL (ref 4.20–5.80)
RDW: 13.3 % (ref 11.0–15.0)
WBC: 7.2 10*3/uL (ref 3.8–10.8)

## 2021-06-16 LAB — COMPREHENSIVE METABOLIC PANEL
AG Ratio: 1.1 (calc) (ref 1.0–2.5)
ALT: 61 U/L — ABNORMAL HIGH (ref 9–46)
AST: 46 U/L — ABNORMAL HIGH (ref 10–40)
Albumin: 3.9 g/dL (ref 3.6–5.1)
Alkaline phosphatase (APISO): 82 U/L (ref 36–130)
BUN: 20 mg/dL (ref 7–25)
CO2: 23 mmol/L (ref 20–32)
Calcium: 9.1 mg/dL (ref 8.6–10.3)
Chloride: 97 mmol/L — ABNORMAL LOW (ref 98–110)
Creat: 1.02 mg/dL (ref 0.60–1.26)
Globulin: 3.5 g/dL (calc) (ref 1.9–3.7)
Glucose, Bld: 178 mg/dL — ABNORMAL HIGH (ref 65–99)
Potassium: 3.6 mmol/L (ref 3.5–5.3)
Sodium: 131 mmol/L — ABNORMAL LOW (ref 135–146)
Total Bilirubin: 0.5 mg/dL (ref 0.2–1.2)
Total Protein: 7.4 g/dL (ref 6.1–8.1)

## 2021-06-16 LAB — TSH: TSH: 2.45 mIU/L (ref 0.40–4.50)

## 2021-06-16 LAB — HEMOGLOBIN A1C
Hgb A1c MFr Bld: 13.4 % of total Hgb — ABNORMAL HIGH (ref <5.7)
Mean Plasma Glucose: 338 mg/dL
eAG (mmol/L): 18.7 mmol/L

## 2021-06-16 LAB — LIPID PANEL
Cholesterol: 184 mg/dL (ref <200)
HDL: 25 mg/dL — ABNORMAL LOW (ref >=40)
Non-HDL Cholesterol (Calc): 159 mg/dL (calc) — ABNORMAL HIGH (ref <130)
Total CHOL/HDL Ratio: 7.4 (calc) — ABNORMAL HIGH (ref <5.0)
Triglycerides: 671 mg/dL — ABNORMAL HIGH (ref <150)

## 2021-07-17 ENCOUNTER — Other Ambulatory Visit: Payer: Self-pay | Admitting: Sports Medicine

## 2021-07-17 DIAGNOSIS — I10 Essential (primary) hypertension: Secondary | ICD-10-CM

## 2021-09-07 ENCOUNTER — Other Ambulatory Visit: Payer: Self-pay | Admitting: Sports Medicine

## 2021-09-22 ENCOUNTER — Telehealth: Payer: Self-pay

## 2021-09-22 NOTE — Telephone Encounter (Addendum)
Initiated Prior authorization XAJ:OINOMVE (1 MG/DOSE) 4MG /3ML pen-injectors Via: Covermymeds Case/Key:[id number] Status: approved  as of 09/22/21 Reason:[place];Coverage Start Date:09/22/2021;Coverage End Date:09/22/2022; Notified Pt via: Mychart

## 2021-10-20 ENCOUNTER — Encounter: Payer: Self-pay | Admitting: Sports Medicine

## 2021-10-23 ENCOUNTER — Other Ambulatory Visit: Payer: Self-pay | Admitting: Sports Medicine

## 2021-10-23 DIAGNOSIS — I10 Essential (primary) hypertension: Secondary | ICD-10-CM

## 2021-11-13 ENCOUNTER — Other Ambulatory Visit: Payer: Self-pay

## 2021-11-13 DIAGNOSIS — E782 Mixed hyperlipidemia: Secondary | ICD-10-CM

## 2021-11-13 MED ORDER — ROSUVASTATIN CALCIUM 20 MG PO TABS: 20.0000 mg | ORAL_TABLET | Freq: Every day | ORAL | 0 refills | Status: DC

## 2021-12-02 ENCOUNTER — Other Ambulatory Visit: Payer: Self-pay | Admitting: Sports Medicine

## 2021-12-06 ENCOUNTER — Telehealth: Payer: Self-pay

## 2021-12-06 NOTE — Telephone Encounter (Addendum)
Initiated Prior authorization HAL:[id number] 40-25MG  tablets Via: Covermymeds Case/Key:[id number] Status: cancelled as of 12/06/21 Reason:inactive insurance Notified Pt via: Mychart

## 2021-12-13 ENCOUNTER — Ambulatory Visit: Payer: Self-pay | Admitting: Sports Medicine

## 2021-12-17 ENCOUNTER — Other Ambulatory Visit: Payer: Self-pay | Admitting: Sports Medicine

## 2021-12-17 DIAGNOSIS — E782 Mixed hyperlipidemia: Secondary | ICD-10-CM

## 2021-12-17 DIAGNOSIS — I10 Essential (primary) hypertension: Secondary | ICD-10-CM

## 2021-12-28 ENCOUNTER — Encounter: Payer: Self-pay | Admitting: Sports Medicine

## 2022-01-03 ENCOUNTER — Encounter (INDEPENDENT_AMBULATORY_CARE_PROVIDER_SITE_OTHER): Payer: Self-pay | Admitting: Sports Medicine

## 2022-01-03 DIAGNOSIS — I10 Essential (primary) hypertension: Secondary | ICD-10-CM

## 2022-01-04 MED ORDER — VALSARTAN-HYDROCHLOROTHIAZIDE 320-25 MG PO TABS: 1.0000 | ORAL_TABLET | Freq: Every day | ORAL | 3 refills | Status: DC

## 2022-01-04 NOTE — Telephone Encounter (Signed)
I spent 5 total minutes of online digital evaluation and management services in this patient-initiated request for online care. 

## 2022-01-04 NOTE — Assessment & Plan Note (Signed)
Edarbyclor no longer covered by his insurance, switching to valsartan/HCTZ.  It looks like they are requesting that we try 5 different types of generics before they would cover Edarbyclor.  Return in 2 weeks for nurse visit blood pressure check or check at home in 2 weeks and let me know for dose titration.

## 2022-01-13 ENCOUNTER — Other Ambulatory Visit: Payer: Self-pay | Admitting: Sports Medicine

## 2022-02-01 ENCOUNTER — Encounter: Payer: Self-pay | Admitting: Sports Medicine

## 2022-02-02 ENCOUNTER — Other Ambulatory Visit: Payer: Self-pay | Admitting: Sports Medicine

## 2022-02-09 ENCOUNTER — Encounter (HOSPITAL_COMMUNITY): Payer: Self-pay | Admitting: *Deleted

## 2022-03-02 ENCOUNTER — Other Ambulatory Visit: Payer: Self-pay | Admitting: Sports Medicine

## 2022-03-02 DIAGNOSIS — I10 Essential (primary) hypertension: Secondary | ICD-10-CM

## 2022-03-10 ENCOUNTER — Other Ambulatory Visit: Payer: Self-pay | Admitting: Sports Medicine

## 2022-03-20 ENCOUNTER — Other Ambulatory Visit: Payer: Self-pay | Admitting: Sports Medicine

## 2022-03-20 DIAGNOSIS — I10 Essential (primary) hypertension: Secondary | ICD-10-CM

## 2022-03-21 ENCOUNTER — Other Ambulatory Visit: Payer: Self-pay | Admitting: Sports Medicine

## 2022-04-02 ENCOUNTER — Encounter: Payer: Self-pay | Admitting: Sports Medicine

## 2022-04-07 ENCOUNTER — Other Ambulatory Visit: Payer: Self-pay | Admitting: Sports Medicine

## 2022-04-19 ENCOUNTER — Encounter: Payer: Self-pay | Admitting: Sports Medicine

## 2022-04-24 ENCOUNTER — Encounter: Payer: Self-pay | Admitting: Sports Medicine

## 2022-04-24 ENCOUNTER — Ambulatory Visit (INDEPENDENT_AMBULATORY_CARE_PROVIDER_SITE_OTHER): Payer: Managed Care, Other (non HMO) | Admitting: Sports Medicine

## 2022-04-24 VITALS — BP 134/76 | HR 60 | Ht 68.0 in | Wt 288.0 lb

## 2022-04-24 DIAGNOSIS — I10 Essential (primary) hypertension: Secondary | ICD-10-CM

## 2022-04-24 DIAGNOSIS — E538 Deficiency of other specified B group vitamins: Secondary | ICD-10-CM

## 2022-04-24 DIAGNOSIS — E559 Vitamin D deficiency, unspecified: Secondary | ICD-10-CM | POA: Diagnosis not present

## 2022-04-24 DIAGNOSIS — Z0000 Encounter for general adult medical examination without abnormal findings: Secondary | ICD-10-CM

## 2022-04-24 DIAGNOSIS — Z903 Acquired absence of stomach [part of]: Secondary | ICD-10-CM | POA: Diagnosis not present

## 2022-04-24 DIAGNOSIS — L816 Other disorders of diminished melanin formation: Secondary | ICD-10-CM | POA: Insufficient documentation

## 2022-04-24 DIAGNOSIS — E119 Type 2 diabetes mellitus without complications: Secondary | ICD-10-CM

## 2022-04-24 DIAGNOSIS — L819 Disorder of pigmentation, unspecified: Secondary | ICD-10-CM

## 2022-04-24 LAB — POCT GLYCOSYLATED HEMOGLOBIN (HGB A1C): HbA1c, POC (controlled diabetic range): 6.8 % (ref 0.0–7.0)

## 2022-04-24 MED ORDER — OZEMPIC (2 MG/DOSE) 8 MG/3ML ~~LOC~~ SOPN: 2.0000 mg | PEN_INJECTOR | SUBCUTANEOUS | 11 refills | Status: DC

## 2022-04-24 NOTE — Progress Notes (Addendum)
Subjective:    CC: Annual Physical Exam  HPI:  This patient is here for their annual physical  I reviewed the past medical history, family history, social history, surgical history, and allergies today and no changes were needed.  Please see the problem list section below in epic for further details.  Past Medical History: Past Medical History:  Diagnosis Date   Diabetes mellitus without complication (HCC)    GERD (gastroesophageal reflux disease)    Hypertension    Past Surgical History: Past Surgical History:  Procedure Laterality Date   LAPAROSCOPIC GASTRIC SLEEVE RESECTION N/A 07/16/2017   Procedure: LAPAROSCOPIC GASTRIC SLEEVE RESECTION WITH UPPER ENDO;  Surgeon: Gaynelle Adu, MD;  Location: [facility];  Service: General;  Laterality: N/A;   Social History: Social History   Socioeconomic History   Marital status: Single    Spouse name: Not on file   Number of children: Not on file   Years of education: Not on file   Highest education level: Not on file  Occupational History   Not on file  Tobacco Use   Smoking status: Never   Smokeless tobacco: Never  Vaping Use   Vaping Use: Never used  Substance and Sexual Activity   Alcohol use: No   Drug use: No   Sexual activity: Yes  Other Topics Concern   Not on file  Social History Narrative   Not on file   Social Determinants of Health   Financial Resource Strain: Not on file  Food Insecurity: Not on file  Transportation Needs: Not on file  Physical Activity: Not on file  Stress: Not on file  Social Connections: Not on file   Family History: Family History  Problem Relation Age of Onset   Hypertension Mother    Allergies: No Known Allergies Medications: See med rec.  Review of Systems: No headache, visual changes, nausea, vomiting, diarrhea, constipation, dizziness, abdominal pain, skin rash, fevers, chills, night sweats, swollen lymph nodes, weight loss, chest pain, body aches, joint swelling, muscle  aches, shortness of breath, mood changes, visual or auditory hallucinations.  Objective:    General: Well Developed, well nourished, and in no acute distress.  Neuro: Alert and oriented x3, extra-ocular muscles intact, sensation grossly intact. Cranial nerves II through XII are intact, motor, sensory, and coordinative functions are all intact. HEENT: Normocephalic, atraumatic, pupils equal round reactive to light, neck supple, no masses, no lymphadenopathy, thyroid nonpalpable. Oropharynx, nasopharynx, external ear canals are unremarkable. Skin: Warm and dry, no rashes noted.  Cardiac: Regular rate and rhythm, no murmurs rubs or gallops.  Respiratory: Clear to auscultation bilaterally. Not using accessory muscles, speaking in full sentences.  Abdominal: Soft, nontender, nondistended, positive bowel sounds, no masses, no organomegaly.  Musculoskeletal: Shoulder, elbow, wrist, hip, knee, ankle stable, and with full range of motion.  Impression and Recommendations:    The patient was counselled, risk factors were discussed, anticipatory guidance given.  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  Annual physical exam Annual physical as above.  Diabetes mellitus, type 2 (HCC) Hemoglobin A1c has improved from 13 down to 6.8%, patient has been off of Ozempic for over a month now. I am going to refill it for a year at the higher dose. I would also like him to get his diabetic eye exam. We did a foot exam today. Up-to-date on other screenings.  Essential hypertension, benign Under adequate control, no changes needed.  Morbid obesity (HCC) Good continued weight loss.  Skin hypopigmentation Unclear etiology, I would like a dermatology referral.  B12 deficiency B12 at the bottom of the normal range, adding supplementation, recheck in 2 months to ensure he is absorbing it, if B12 still at the bottom of the normal range we will switch to  parenteral supplementation.   ____________________________________________ Ihor Austin. Benjamin Stain, M.D., ABFM., CAQSM., AME. Primary Care and Sports Medicine [facility]  Adjunct Professor of Family Medicine  [place] [employer]  Restaurant manager, fast food

## 2022-04-24 NOTE — Assessment & Plan Note (Signed)
Hemoglobin A1c has improved from 13 down to 6.8%, patient has been off of Ozempic for over a month now. I am going to refill it for a year at the higher dose. I would also like him to get his diabetic eye exam. We did a foot exam today. Up-to-date on other screenings.

## 2022-04-24 NOTE — Assessment & Plan Note (Signed)
Unclear etiology, I would like a dermatology referral.

## 2022-04-24 NOTE — Addendum Note (Signed)
Addended by: Dema Severin on: 04/24/2022 04:22 PM   Modules accepted: Orders

## 2022-04-24 NOTE — Assessment & Plan Note (Signed)
Annual physical as above.  

## 2022-04-24 NOTE — Assessment & Plan Note (Signed)
Doing well post gastric sleeve, adding B12 levels.

## 2022-04-24 NOTE — Assessment & Plan Note (Signed)
Under adequate control, no changes needed.

## 2022-04-24 NOTE — Assessment & Plan Note (Signed)
Good continued weight loss.

## 2022-04-25 DIAGNOSIS — E538 Deficiency of other specified B group vitamins: Secondary | ICD-10-CM | POA: Insufficient documentation

## 2022-04-25 LAB — COMPLETE METABOLIC PANEL WITH GFR
AG Ratio: 1.3 (calc) (ref 1.0–2.5)
ALT: 11 U/L (ref 9–46)
AST: 10 U/L (ref 10–40)
Albumin: 4.1 g/dL (ref 3.6–5.1)
Alkaline phosphatase (APISO): 85 U/L (ref 36–130)
BUN: 12 mg/dL (ref 7–25)
CO2: 21 mmol/L (ref 20–32)
Calcium: 9.3 mg/dL (ref 8.6–10.3)
Chloride: 102 mmol/L (ref 98–110)
Creat: 0.98 mg/dL (ref 0.60–1.26)
Globulin: 3.2 g/dL (calc) (ref 1.9–3.7)
Glucose, Bld: 97 mg/dL (ref 65–139)
Potassium: 3.8 mmol/L (ref 3.5–5.3)
Sodium: 138 mmol/L (ref 135–146)
Total Bilirubin: 0.4 mg/dL (ref 0.2–1.2)
Total Protein: 7.3 g/dL (ref 6.1–8.1)
eGFR: 101 mL/min/{1.73_m2} (ref >=60)

## 2022-04-25 LAB — LIPID PANEL
Cholesterol: 128 mg/dL (ref <200)
HDL: 37 mg/dL — ABNORMAL LOW (ref >=40)
LDL Cholesterol (Calc): 66 mg/dL (calc)
Non-HDL Cholesterol (Calc): 91 mg/dL (calc) (ref <130)
Total CHOL/HDL Ratio: 3.5 (calc) (ref <5.0)
Triglycerides: 171 mg/dL — ABNORMAL HIGH (ref <150)

## 2022-04-25 LAB — CBC
HCT: 47.4 % (ref 38.5–50.0)
Hemoglobin: 16 g/dL (ref 13.2–17.1)
MCH: 29.9 pg (ref 27.0–33.0)
MCHC: 33.8 g/dL (ref 32.0–36.0)
MCV: 88.4 fL (ref 80.0–100.0)
MPV: 10.3 fL (ref 7.5–12.5)
Platelets: 309 10*3/uL (ref 140–400)
RBC: 5.36 10*6/uL (ref 4.20–5.80)
RDW: 14.3 % (ref 11.0–15.0)
WBC: 8.8 10*3/uL (ref 3.8–10.8)

## 2022-04-25 LAB — VITAMIN B12: Vitamin B-12: 228 pg/mL (ref 200–1100)

## 2022-04-25 LAB — VITAMIN D 25 HYDROXY (VIT D DEFICIENCY, FRACTURES): Vit D, 25-Hydroxy: 10 ng/mL — ABNORMAL LOW (ref 30–100)

## 2022-04-25 LAB — TSH: TSH: 2.47 mIU/L (ref 0.40–4.50)

## 2022-04-25 LAB — SPECIMEN COMPROMISED

## 2022-04-25 MED ORDER — VITAMIN D (ERGOCALCIFEROL) 1.25 MG (50000 UNIT) PO CAPS: 50000.0000 [IU] | ORAL_CAPSULE | ORAL | 0 refills | Status: DC

## 2022-04-25 MED ORDER — VITAMIN B-12 1000 MCG PO TABS: 1000.0000 ug | ORAL_TABLET | Freq: Every day | ORAL | 3 refills | Status: DC

## 2022-04-25 NOTE — Assessment & Plan Note (Signed)
B12 at the bottom of the normal range, adding supplementation, recheck in 2 months to ensure he is absorbing it, if B12 still at the bottom of the normal range we will switch to parenteral supplementation.

## 2022-04-25 NOTE — Addendum Note (Signed)
Addended by: Silverio Decamp on: 04/25/2022 12:21 PM   Modules accepted: Orders

## 2022-06-25 ENCOUNTER — Other Ambulatory Visit: Payer: Self-pay | Admitting: Sports Medicine

## 2022-06-25 DIAGNOSIS — E538 Deficiency of other specified B group vitamins: Secondary | ICD-10-CM

## 2022-06-25 DIAGNOSIS — E559 Vitamin D deficiency, unspecified: Secondary | ICD-10-CM

## 2022-07-08 ENCOUNTER — Other Ambulatory Visit: Payer: Self-pay | Admitting: Sports Medicine

## 2022-07-08 DIAGNOSIS — E538 Deficiency of other specified B group vitamins: Secondary | ICD-10-CM

## 2022-07-24 ENCOUNTER — Ambulatory Visit: Payer: Self-pay | Admitting: Sports Medicine

## 2022-12-10 ENCOUNTER — Ambulatory Visit: Payer: Managed Care, Other (non HMO) | Admitting: Sports Medicine

## 2022-12-10 ENCOUNTER — Encounter: Payer: Self-pay | Admitting: Sports Medicine

## 2022-12-10 VITALS — BP 126/79 | HR 62

## 2022-12-10 DIAGNOSIS — I10 Essential (primary) hypertension: Secondary | ICD-10-CM | POA: Diagnosis not present

## 2022-12-10 DIAGNOSIS — E538 Deficiency of other specified B group vitamins: Secondary | ICD-10-CM | POA: Diagnosis not present

## 2022-12-10 DIAGNOSIS — L819 Disorder of pigmentation, unspecified: Secondary | ICD-10-CM | POA: Diagnosis not present

## 2022-12-10 DIAGNOSIS — L816 Other disorders of diminished melanin formation: Secondary | ICD-10-CM

## 2022-12-10 DIAGNOSIS — E119 Type 2 diabetes mellitus without complications: Secondary | ICD-10-CM

## 2022-12-10 DIAGNOSIS — Z794 Long term (current) use of insulin: Secondary | ICD-10-CM

## 2022-12-10 NOTE — Assessment & Plan Note (Signed)
Referred to dermatology about a year ago, he is now developing photosensitivity over his left arm. We will write him exemption for car tint, and I would like to get him back into his old dermatologist.

## 2022-12-10 NOTE — Progress Notes (Signed)
    Procedures performed today:    None.  Independent interpretation of notes and tests performed by another provider:   None.  Brief History, Exam, Impression, and Recommendations:    Essential hypertension, benign Blood pressure is well-controlled now, no change in plan.  Skin hypopigmentation Referred to dermatology about a year ago, he is now developing photosensitivity over his left arm. We will write him exemption for car tint, and I would like to get him back into his old dermatologist.    ____________________________________________ Ihor Austin. Benjamin Stain, M.D., ABFM., CAQSM., AME. Primary Care and Sports Medicine [facility]  Adjunct Professor of Family Medicine  [place] [employer] [facility]  Restaurant manager, fast food

## 2022-12-10 NOTE — Assessment & Plan Note (Signed)
Blood pressure is well-controlled now, no change in plan.

## 2022-12-12 ENCOUNTER — Other Ambulatory Visit: Payer: Self-pay | Admitting: Sports Medicine

## 2022-12-12 DIAGNOSIS — I10 Essential (primary) hypertension: Secondary | ICD-10-CM

## 2022-12-13 ENCOUNTER — Telehealth: Payer: Self-pay

## 2022-12-13 NOTE — Telephone Encounter (Signed)
Done and back in my box for patient pickup

## 2022-12-13 NOTE — Telephone Encounter (Signed)
Patient came by office to drop off tint exemption form to be completed by provider, forms placed in Dr. Melvia Heaps box, thanks.

## 2022-12-20 ENCOUNTER — Ambulatory Visit
Admission: RE | Admit: 2022-12-20 | Discharge: 2022-12-20 | Disposition: A | Discharge status: Home / Self Care | Payer: Managed Care, Other (non HMO) | Source: Ambulatory Visit | Attending: Internal Medicine | Admitting: Internal Medicine

## 2022-12-20 VITALS — BP 166/105 | HR 71 | Temp 98.3°F | Resp 16

## 2022-12-20 DIAGNOSIS — J069 Acute upper respiratory infection, unspecified: Secondary | ICD-10-CM | POA: Diagnosis not present

## 2022-12-20 DIAGNOSIS — Z1152 Encounter for screening for COVID-19: Secondary | ICD-10-CM

## 2022-12-20 LAB — POC SARS CORONAVIRUS 2 AG -  ED: SARS Coronavirus 2 Ag: NEGATIVE

## 2022-12-20 MED ORDER — BENZONATATE 100 MG PO CAPS: 100.0000 mg | ORAL_CAPSULE | Freq: Three times a day (TID) | ORAL | 0 refills | Status: DC

## 2022-12-20 NOTE — Discharge Instructions (Signed)
You have a viral upper respiratory infection.  COVID testing in clinic is negative.  Use the following medicines to help with symptoms: - Plain Mucinex (guaifenesin) over the counter as directed every 12 hours to thin mucous so that you are able to get it out of your body easier. Drink plenty of water while taking this medication so that it works well in your body (at least 8 cups a day).  - Tylenol 1,000mg  and/or ibuprofen 600mg  every 6 hours with food as needed for aches/pains or fever/chills.  - Tessalon perles every 8 hours as needed for cough.  1 tablespoon of honey in warm water and/or salt water gargles may also help with symptoms. Humidifier to your room will help add water to the air and reduce coughing.  If you develop any new or worsening symptoms, please return.  If your symptoms are severe, please go to the emergency room.  Follow-up with your primary care provider for further evaluation and management of your symptoms as well as ongoing wellness visits.  I hope you feel better!

## 2022-12-20 NOTE — ED Triage Notes (Signed)
[facility] to [facility] with [facility] of sore throat since, runny nose, and decreased taste since Monday. Pt reports otc musinex and emergan- C

## 2022-12-20 NOTE — ED Provider Notes (Addendum)
[facility]    CSN: [id number] Arrival date & time: 12/20/22  1419      History   Chief Complaint Chief Complaint  Patient presents with   Nasal Congestion    Lost of taste and smell - Entered by patient   Sore Throat    HPI Richard Mooney is a 37 y.o. male.   Patient presents to urgent care for evaluation of nasal congestion, sore throat, fatigue, and cough that started 4 days ago on Monday, Dec 17, 2022.  No recent known sick contacts with similar symptoms.  History of type 2 diabetes, GERD, and hypertension.  He states his fiance at home recently had a kidney transplant and is concerned about getting her sick.  Cough is dry and nonproductive.  Denies headache, body aches, fever, chills, rash, dizziness, shortness of breath, chest pain, heart palpitations, nausea, vomiting, diarrhea, and abdominal pain.  Non-smoker, denies drug use.  Denies recent antibiotic/steroid use.  No history of chronic respiratory problems.  He has been using Mucinex and emergen-C over the counter with some relief.  States today he lost his taste and smell.  He has not taken an at home COVID test.   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 Authorizing Provider  amLODipine (NORVASC) 10 MG tablet Take 1 tablet by mouth once daily 10/23/21  Yes Monica Becton, MD  benzonatate (TESSALON) 100 MG capsule Take 1 capsule (100 mg total) by mouth every 8 (eight) hours. 12/20/22  Yes Carlisle Beers, FNP  carvedilol (COREG) 25 MG tablet TAKE 1 TABLET BY MOUTH TWICE DAILY WITH A MEAL 12/18/21  Yes Monica Becton, MD  valsartan-hydrochlorothiazide (DIOVAN-HCT) 320-25 MG tablet Take 1 tablet by mouth once daily 03/05/22  Yes Monica Becton, MD  AMBULATORY NON FORMULARY MEDICATION Continuous positive airway pressure (CPAP) machine set at 12 cm of H2O pressure, with all supplemental supplies as needed. 07/03/16   Monica Becton, MD  cyanocobalamin (VITAMIN B12) 1000 MCG tablet Take 1 tablet (1,000 mcg total) by mouth daily. 04/25/22   Monica Becton, MD  rosuvastatin (CRESTOR) 20 MG tablet Take 1 tablet by mouth once daily 12/18/21   Monica Becton, MD  Semaglutide, 2 MG/DOSE, (OZEMPIC, 2 MG/DOSE,) 8 MG/3ML SOPN Inject 2 mg into the skin once a week. 04/24/22   Monica Becton, MD  Vitamin D, Ergocalciferol, (DRISDOL) 1.25 MG (50000 UNIT) CAPS  capsule Take 1 capsule by mouth once a week 07/09/22   Monica Becton, MD  phentermine 37.5 MG capsule Take 1 capsule (37.5 mg total) by mouth every morning. 07/23/13 02/25/14  Monica Becton, MD    Family History Family History  Problem Relation Age of Onset   Hypertension Mother     Social History Social History   Tobacco Use   Smoking status: Never   Smokeless tobacco: Never  Vaping Use   Vaping Use: Never used   Substance Use Topics   Alcohol use: No   Drug use: No     Allergies   Patient has no known allergies.   Review of Systems Review of Systems Per HPI  Physical Exam Triage Vital Signs ED Triage Vitals [12/20/22 1438]  Enc Vitals Group     BP (!) 166/105     Pulse Rate 71     Resp 16     Temp 98.3 F (36.8 C)     Temp Source Oral     SpO2 95 %     Weight      Height      Head Circumference      Peak Flow      Pain Score      Pain Loc      Pain Edu?      Excl. in GC?    No data found.  Updated Vital Signs BP (!) 166/105   Pulse 71   Temp 98.3 F (36.8 C) (Oral)   Resp 16   SpO2 95%   Visual Acuity Right Eye Distance:   Left Eye Distance:   Bilateral Distance:    Right Eye Near:   Left Eye Near:    Bilateral Near:     Physical Exam Vitals and nursing note reviewed.  Constitutional:      Appearance: He is not ill-appearing or toxic-appearing.  HENT:     Head: Normocephalic and atraumatic.     Right Ear: Hearing, tympanic membrane, ear canal and external ear normal.     Left Ear: Hearing, tympanic membrane, ear canal and external ear normal.     Nose: Congestion present.     Mouth/Throat:     Lips: Pink.     Mouth: Mucous membranes are moist. No injury.     Tongue: No lesions. Tongue does not deviate from midline.     Palate: No mass and lesions.     Pharynx: Oropharynx is clear. Uvula midline. No pharyngeal swelling, oropharyngeal exudate, posterior oropharyngeal erythema or uvula swelling.     Tonsils: No tonsillar exudate or tonsillar abscesses.  Eyes:     General: Lids are normal. Vision grossly intact. Gaze aligned appropriately.     Extraocular Movements: Extraocular movements intact.     Conjunctiva/sclera: Conjunctivae normal.  Cardiovascular:     Rate and Rhythm: Normal rate and regular rhythm.     Heart sounds: Normal heart sounds, S1 normal and S2 normal.  Pulmonary:     Effort: Pulmonary effort is normal. No respiratory distress.      Breath sounds: Normal breath sounds and air entry.  Musculoskeletal:     Cervical back: Neck supple.  Skin:    General: Skin is warm and dry.     Capillary Refill: Capillary refill takes less than 2 seconds.     Findings: No rash.  Neurological:     General: No focal deficit present.     Mental Status: He is alert and oriented to person,  place, and time. Mental status is at baseline.     Cranial Nerves: No dysarthria or facial asymmetry.  Psychiatric:        Mood and Affect: Mood normal.        Speech: Speech normal.        Behavior: Behavior normal.        Thought Content: Thought content normal.        Judgment: Judgment normal.      UC Treatments / Results  Labs (all labs ordered are listed, but only abnormal results are displayed) Labs Reviewed  POC SARS CORONAVIRUS 2 AG -  ED    EKG   Radiology No results found.  Procedures Procedures (including critical care time)  Medications Ordered in UC Medications - No data to display  Initial Impression / Assessment and Plan / UC Course  I have reviewed the triage vital signs and the nursing notes.  Pertinent labs & imaging results that were available during my care of the patient were reviewed by me and considered in my medical decision making (see chart for details).   1.  Viral URI with cough, encounter for screening for COVID-19 Symptoms and physical exam consistent with a viral upper respiratory tract infection that will likely resolve with rest, fluids, and prescriptions for symptomatic relief. Deferred imaging based on stable cardiopulmonary exam and hemodynamically stable vital signs.  Point-of-care COVID-19 testing is negative.   Tessalon Perles sent to pharmacy for symptomatic relief to be taken as prescribed.  May continue taking over the counter medications as directed for further symptomatic relief.   Nonpharmacologic interventions for symptom relief provided and after visit summary below. Advised to push  fluids to stay well hydrated while recovering from viral illness.   Discussed physical exam and available lab work findings in clinic with patient.  Counseled patient regarding appropriate use of medications and potential side effects for all medications recommended or prescribed today. Discussed red flag signs and symptoms of worsening condition,when to call the PCP office, return to urgent care, and when to seek higher level of care in the emergency department. Patient verbalizes understanding and agreement with plan. All questions answered. Patient discharged in stable condition.   Final Clinical Impressions(s) / UC Diagnoses   Final diagnoses:  Viral URI with cough  Encounter for screening for COVID-19     Discharge Instructions      You have a viral upper respiratory infection.  COVID testing in clinic is negative.  Use the following medicines to help with symptoms: - Plain Mucinex (guaifenesin) over the counter as directed every 12 hours to thin mucous so that you are able to get it out of your body easier. Drink plenty of water while taking this medication so that it works well in your body (at least 8 cups a day).  - Tylenol 1,000mg  and/or ibuprofen 600mg  every 6 hours with food as needed for aches/pains or fever/chills.  - Tessalon perles every 8 hours as needed for cough.  1 tablespoon of honey in warm water and/or salt water gargles may also help with symptoms. Humidifier to your room will help add water to the air and reduce coughing.  If you develop any new or worsening symptoms, please return.  If your symptoms are severe, please go to the emergency room.  Follow-up with your primary care provider for further evaluation and management of your symptoms as well as ongoing wellness visits.  I hope you feel better!   ED Prescriptions  Medication Sig Dispense Auth. Provider   benzonatate (TESSALON) 100 MG capsule Take 1 capsule (100 mg total) by mouth every 8 (eight)  hours. 21 capsule Carlisle Beers, FNP      PDMP not reviewed this encounter.   Carlisle Beers, FNP 12/20/22 1530    Carlisle Beers, FNP 12/20/22 1531

## 2022-12-24 ENCOUNTER — Other Ambulatory Visit: Payer: Self-pay | Admitting: Sports Medicine

## 2022-12-24 DIAGNOSIS — E538 Deficiency of other specified B group vitamins: Secondary | ICD-10-CM

## 2022-12-24 DIAGNOSIS — I10 Essential (primary) hypertension: Secondary | ICD-10-CM

## 2022-12-24 DIAGNOSIS — E782 Mixed hyperlipidemia: Secondary | ICD-10-CM

## 2022-12-25 MED ORDER — ROSUVASTATIN CALCIUM 20 MG PO TABS
20.0000 mg | ORAL_TABLET | Freq: Every day | ORAL | 3 refills | Status: DC
Start: 2022-12-25 — End: 2024-04-21

## 2022-12-25 MED ORDER — CARVEDILOL 25 MG PO TABS: 25.0000 mg | ORAL_TABLET | Freq: Two times a day (BID) | ORAL | 3 refills | Status: DC

## 2022-12-25 MED ORDER — VALSARTAN-HYDROCHLOROTHIAZIDE 320-25 MG PO TABS
1.0000 | ORAL_TABLET | Freq: Every day | ORAL | 3 refills | Status: DC
Start: 2022-12-25 — End: 2024-01-27

## 2023-02-14 LAB — HM DIABETES EYE EXAM

## 2023-02-19 ENCOUNTER — Encounter (HOSPITAL_COMMUNITY): Payer: Self-pay | Admitting: *Deleted

## 2023-03-08 ENCOUNTER — Encounter: Payer: Self-pay | Admitting: Sports Medicine

## 2023-05-21 ENCOUNTER — Other Ambulatory Visit: Payer: Self-pay | Admitting: Sports Medicine

## 2023-05-21 DIAGNOSIS — E119 Type 2 diabetes mellitus without complications: Secondary | ICD-10-CM

## 2023-11-06 ENCOUNTER — Other Ambulatory Visit (HOSPITAL_COMMUNITY): Payer: Self-pay

## 2023-11-06 ENCOUNTER — Telehealth: Payer: Self-pay

## 2023-11-06 NOTE — Telephone Encounter (Signed)
 Pharmacy Patient Advocate Encounter   Received notification from Patient Pharmacy that prior authorization for Ozempic 2 is required/requested.   Insurance verification completed.   The patient is insured through [employer] .   Per test claim: PA required; PA submitted to above mentioned insurance via CoverMyMeds Key/confirmation #/EOC [id number] Status is pending

## 2023-11-08 ENCOUNTER — Other Ambulatory Visit (HOSPITAL_COMMUNITY): Payer: Self-pay

## 2023-11-11 ENCOUNTER — Other Ambulatory Visit (HOSPITAL_COMMUNITY): Payer: Self-pay

## 2023-11-11 NOTE — Telephone Encounter (Signed)
 Pharmacy Patient Advocate Encounter  Received notification from [employer] that Prior Authorization for Ozempic 2 has been APPROVED from 11/05/23 to 11/11/23 with QUANTITY LIMIT of 9 for a 84 day supply. Patient co-pay is $0.00   PA #/Case ID/Reference #: [id number]

## 2023-11-14 ENCOUNTER — Telehealth: Admitting: Sports Medicine

## 2023-11-14 ENCOUNTER — Encounter: Payer: Self-pay | Admitting: Sports Medicine

## 2023-11-14 DIAGNOSIS — I10 Essential (primary) hypertension: Secondary | ICD-10-CM

## 2023-11-14 MED ORDER — AMLODIPINE BESYLATE 5 MG PO TABS
5.0000 mg | ORAL_TABLET | Freq: Every day | ORAL | 11 refills | Status: DC
Start: 2023-11-14 — End: 2024-04-21

## 2023-11-14 NOTE — Assessment & Plan Note (Signed)
 Very pleasant 38 year old male, he has done really well with his weight loss, historically he has been on amlodipine 10, high-dose valsartan hydrochlorothiazide and high dose Coreg, he came off of his amlodipine. Now blood pressure is looking a bit higher, 130s to 160s systolic. We will restart amlodipine at 5 mg daily, he will check it at home over the next 2 weeks and come to see me for a fasting annual physical.

## 2023-11-14 NOTE — Progress Notes (Signed)
   Virtual Visit via WebEx/MyChart   I connected with  Hashim Eichhorst  on 11/14/23 via WebEx/MyChart/Doximity Video and verified that I am speaking with the correct person using two identifiers.   I discussed the limitations, risks, security and privacy concerns of performing an evaluation and management service by WebEx/MyChart/Doximity Video, including the higher likelihood of inaccurate diagnosis and treatment, and the availability of in person appointments.  We also discussed the likely need of an additional face to face encounter for complete and high quality delivery of care.  I also discussed with the patient that there may be a patient responsible charge related to this service. The patient expressed understanding and wishes to proceed.  Provider location is in medical facility. Patient location is at their home, different from provider location. People involved in care of the patient during this telehealth encounter were myself, my nurse/medical assistant, and my front office/scheduling team member.  Review of Systems: No fevers, chills, night sweats, weight loss, chest pain, or shortness of breath.   Objective Findings:    General: Speaking full sentences, no audible heavy breathing.  Sounds alert and appropriately interactive.  Appears well.  Face symmetric.  Extraocular movements intact.  Pupils equal and round.  No nasal flaring or accessory muscle use visualized.  Independent interpretation of tests performed by another provider:   None.  Brief History, Exam, Impression, and Recommendations:    Essential hypertension, benign Very pleasant 38 year old male, he has done really well with his weight loss, historically he has been on amlodipine 10, high-dose valsartan hydrochlorothiazide and high dose Coreg, he came off of his amlodipine. Now blood pressure is looking a bit higher, 130s to 160s systolic. We will restart amlodipine at 5 mg daily, he will check it at home over the next  2 weeks and come to see me for a fasting annual physical.   I discussed the above assessment and treatment plan with the patient. The patient was provided an opportunity to ask questions and all were answered. The patient agreed with the plan and demonstrated an understanding of the instructions.   The patient was advised to call back or seek an in-person evaluation if the symptoms worsen or if the condition fails to improve as anticipated.   I provided 30 minutes of face to face and non-face-to-face time during this encounter date, time was needed to gather information, review chart, records, communicate/coordinate with staff remotely, as well as complete documentation.   ____________________________________________ Ihor Austin. Benjamin Stain, M.D., ABFM., CAQSM., AME. Primary Care and Sports Medicine [facility]  Adjunct Professor of Family Medicine  [employer]  Restaurant manager, fast food

## 2024-01-21 ENCOUNTER — Other Ambulatory Visit: Payer: Self-pay | Admitting: Sports Medicine

## 2024-01-21 DIAGNOSIS — I10 Essential (primary) hypertension: Secondary | ICD-10-CM

## 2024-01-26 ENCOUNTER — Other Ambulatory Visit: Payer: Self-pay | Admitting: Sports Medicine

## 2024-01-26 DIAGNOSIS — I10 Essential (primary) hypertension: Secondary | ICD-10-CM

## 2024-02-21 ENCOUNTER — Encounter (HOSPITAL_COMMUNITY): Payer: Self-pay | Admitting: *Deleted

## 2024-04-07 ENCOUNTER — Encounter: Payer: Self-pay | Admitting: Sports Medicine

## 2024-04-21 ENCOUNTER — Ambulatory Visit: Admitting: Medical-Surgical

## 2024-04-21 VITALS — BP 132/78 | HR 61 | Resp 20 | Ht 68.0 in | Wt 291.0 lb

## 2024-04-21 DIAGNOSIS — E119 Type 2 diabetes mellitus without complications: Secondary | ICD-10-CM

## 2024-04-21 DIAGNOSIS — G4733 Obstructive sleep apnea (adult) (pediatric): Secondary | ICD-10-CM

## 2024-04-21 DIAGNOSIS — E782 Mixed hyperlipidemia: Secondary | ICD-10-CM

## 2024-04-21 DIAGNOSIS — I10 Essential (primary) hypertension: Secondary | ICD-10-CM

## 2024-04-21 DIAGNOSIS — E1169 Type 2 diabetes mellitus with other specified complication: Secondary | ICD-10-CM | POA: Diagnosis not present

## 2024-04-21 DIAGNOSIS — E538 Deficiency of other specified B group vitamins: Secondary | ICD-10-CM

## 2024-04-21 LAB — POCT GLYCOSYLATED HEMOGLOBIN (HGB A1C)
HbA1c, POC (controlled diabetic range): 6.6 % (ref 0.0–7.0)
Hemoglobin A1C: 6.6 % — AB (ref 4.0–5.6)

## 2024-04-21 LAB — POCT UA - MICROALBUMIN
Albumin/Creatinine Ratio, Urine, POC: <30
Creatinine, POC: 300 mg/dL
Microalbumin Ur, POC: 30 mg/L

## 2024-04-21 MED ORDER — TIRZEPATIDE 10 MG/0.5ML ~~LOC~~ SOAJ: 10.0000 mg | SUBCUTANEOUS | 3 refills | Status: AC

## 2024-04-21 MED ORDER — VITAMIN B-12 1000 MCG PO TABS
1000.0000 ug | ORAL_TABLET | Freq: Every day | ORAL | 3 refills | Status: AC
Start: 2024-04-21 — End: ?

## 2024-04-21 MED ORDER — VALSARTAN-HYDROCHLOROTHIAZIDE 320-25 MG PO TABS
ORAL_TABLET | ORAL | 3 refills | Status: AC
Start: 2024-04-21 — End: ?

## 2024-04-21 MED ORDER — ROSUVASTATIN CALCIUM 20 MG PO TABS
20.0000 mg | ORAL_TABLET | Freq: Every day | ORAL | 3 refills | Status: AC
Start: 2024-04-21 — End: ?

## 2024-04-21 MED ORDER — CARVEDILOL 25 MG PO TABS: 25.0000 mg | ORAL_TABLET | Freq: Two times a day (BID) | ORAL | 0 refills | Status: DC

## 2024-04-21 MED ORDER — AMLODIPINE BESYLATE 5 MG PO TABS
5.0000 mg | ORAL_TABLET | Freq: Every day | ORAL | 11 refills | Status: AC
Start: 2024-04-21 — End: ?

## 2024-04-21 NOTE — Progress Notes (Unsigned)
        Established patient visit   History of Present Illness   Discussed the use of AI scribe software for clinical note transcription with the patient, who gave verbal consent to proceed.  History of Present Illness            Physical Exam   Physical Exam  Assessment & Plan   Assessment and Plan               Follow up   No follow-ups on file.  __________________________________ Richard Mooney Palin, DNP, APRN, FNP-BC Primary Care and Sports Medicine [facility] [place]

## 2024-04-22 ENCOUNTER — Encounter: Payer: Self-pay | Admitting: Medical-Surgical

## 2024-04-22 ENCOUNTER — Telehealth: Payer: Self-pay

## 2024-04-22 ENCOUNTER — Other Ambulatory Visit (HOSPITAL_COMMUNITY): Payer: Self-pay

## 2024-04-22 ENCOUNTER — Ambulatory Visit: Payer: Self-pay | Admitting: Medical-Surgical

## 2024-04-22 LAB — CBC WITH DIFFERENTIAL/PLATELET
Basophils Absolute: 0.1 x10E3/uL (ref 0.0–0.2)
Basos: 1 %
EOS (ABSOLUTE): 0.2 x10E3/uL (ref 0.0–0.4)
Eos: 2 %
Hematocrit: 49.2 % (ref 37.5–51.0)
Hemoglobin: 16.4 g/dL (ref 13.0–17.7)
Immature Grans (Abs): 0 x10E3/uL (ref 0.0–0.1)
Immature Granulocytes: 0 %
Lymphocytes Absolute: 3.1 x10E3/uL (ref 0.7–3.1)
Lymphs: 38 %
MCH: 30 pg (ref 26.6–33.0)
MCHC: 33.3 g/dL (ref 31.5–35.7)
MCV: 90 fL (ref 79–97)
Monocytes Absolute: 0.6 x10E3/uL (ref 0.1–0.9)
Monocytes: 8 %
Neutrophils Absolute: 4.3 x10E3/uL (ref 1.4–7.0)
Neutrophils: 51 %
Platelets: 295 x10E3/uL (ref 150–450)
RBC: 5.47 x10E6/uL (ref 4.14–5.80)
RDW: 13.2 % (ref 11.6–15.4)
WBC: 8.3 x10E3/uL (ref 3.4–10.8)

## 2024-04-22 LAB — CMP14+EGFR
ALT: 16 IU/L (ref 0–44)
AST: 13 IU/L (ref 0–40)
Albumin: 4.4 g/dL (ref 4.1–5.1)
Alkaline Phosphatase: 77 IU/L (ref 47–123)
BUN/Creatinine Ratio: 10 (ref 9–20)
BUN: 11 mg/dL (ref 6–24)
Bilirubin Total: 0.4 mg/dL (ref 0.0–1.2)
CO2: 22 mmol/L (ref 20–29)
Calcium: 9.9 mg/dL (ref 8.7–10.2)
Chloride: 100 mmol/L (ref 96–106)
Creatinine, Ser: 1.09 mg/dL (ref 0.76–1.27)
Globulin, Total: 3 g/dL (ref 1.5–4.5)
Glucose: 92 mg/dL (ref 70–99)
Potassium: 4 mmol/L (ref 3.5–5.2)
Sodium: 137 mmol/L (ref 134–144)
Total Protein: 7.4 g/dL (ref 6.0–8.5)
eGFR: 88 mL/min/1.73 (ref >59)

## 2024-04-22 LAB — VITAMIN B12: Vitamin B-12: 469 pg/mL (ref 232–1245)

## 2024-04-22 LAB — LIPID PANEL
Chol/HDL Ratio: 4.9 ratio (ref 0.0–5.0)
Cholesterol, Total: 176 mg/dL (ref 100–199)
HDL: 36 mg/dL — ABNORMAL LOW (ref >39)
LDL Chol Calc (NIH): 111 mg/dL — ABNORMAL HIGH (ref 0–99)
Triglycerides: 162 mg/dL — ABNORMAL HIGH (ref 0–149)
VLDL Cholesterol Cal: 29 mg/dL (ref 5–40)

## 2024-04-22 LAB — TSH: TSH: 1.48 u[IU]/mL (ref 0.450–4.500)

## 2024-04-22 NOTE — Assessment & Plan Note (Signed)
 Ran out of rosuvastatin . Discussed its importance for heart health, especially with diabetes. - Resume rosuvastatin  20mg  daily.  - Checking lipids.

## 2024-04-22 NOTE — Assessment & Plan Note (Signed)
 Needs new CPAP mask due to mouth breathing. Previous prescription not followed up due to job change. - Send prescription to [facility] for CPAP supplies and mask fitting.

## 2024-04-22 NOTE — Assessment & Plan Note (Addendum)
 Adequate control with A1c at 6.6%. Discussed potential switch to Mounjaro  for improved weight loss and glucose control. - Switch to Mounjaro  10 mg weekly. - Checking urine microalbumin, normal. - Recommend monitoring glucose at home, goal 90-120 fasting.  - Recommend following diabetic diet guidelines and increasing regular intentional exercise with a goal for weight loss. - Foot exam completed, no deficits.

## 2024-04-22 NOTE — Assessment & Plan Note (Signed)
 Blood pressure well-controlled with current medications. - Continue current antihypertensive regimen: carvedilol  25mg  BID, amlodipine  5mg  daily, and valsartan  with hydrochlorothiazide  320-25mg  daily. - Recommend low sodium diet. - Recommend regular intentional exercise with a goal for weight loss to a healthy weight.

## 2024-04-22 NOTE — Assessment & Plan Note (Addendum)
 Ran out of vitamin B12 supplements. - Order vitamin B12 level with lab work. - Restart vitamin B12 1000mcg daily.

## 2024-04-22 NOTE — Telephone Encounter (Signed)
 Pharmacy Patient Advocate Encounter   Received notification from CoverMyMeds that prior authorization for Mounjaro  10mg /0.3ml is required/requested.   Insurance verification completed.   The patient is insured through [employer] .   Per test claim: PA required; PA submitted to above mentioned insurance via Latent Key/confirmation #/EOC [id number] Status is pending

## 2024-04-22 NOTE — Assessment & Plan Note (Signed)
 Weight management challenging due to stress and sleep issues despite history of sleeve gastrectomy. Considering Mounjaro  for better outcomes. - Switch to Mounjaro  10 mg weekly. - Reviewed recommendations for increasing exercise and following a high protein, low carb diet.

## 2024-04-28 NOTE — Telephone Encounter (Signed)
 Pharmacy Patient Advocate Encounter  Received notification from [employer] that Prior Authorization for Mounjaro  10mg /0.29ml has been DENIED.  See denial reason below. No denial letter attached in CMM. Will attach denial letter to Media tab once received.   PA #/Case ID/Reference #: [id number]    Faxed additional info to [employer] at [phone] for reconsideration

## 2024-04-30 NOTE — Telephone Encounter (Signed)
 Placed a call to the insurance to check the status.   Per the rep, they did not receive the additional info and requested I send it to another fax number.   Case ID: [id number]  Phone# [phone] Fax# [phone]

## 2024-05-05 ENCOUNTER — Other Ambulatory Visit (HOSPITAL_COMMUNITY): Payer: Self-pay

## 2024-05-05 NOTE — Telephone Encounter (Signed)
 Pharmacy Patient Advocate Encounter  Received notification from [employer] that Prior Authorization for Mounjaro  10mg /0.73ml has been DENIED.  Full denial letter will be uploaded to the media tab. See denial reason below.   PA #/Case ID/Reference #: [id number]    Resubmitted a new prior authorization via fax with A1C levels and chart notes with T2DM diagnosis.

## 2024-05-08 ENCOUNTER — Other Ambulatory Visit (HOSPITAL_COMMUNITY): Payer: Self-pay

## 2024-05-08 NOTE — Telephone Encounter (Signed)
 Placed a call to check on the new prior authorization request submitted.   Per the representative, they have not received any additional information or a new PA. She stated we should send in an appeal for the patient.   Faxed an appeal to [facility]  Phone# [phone] Fax# [phone]  Request# [id number]

## 2024-05-11 ENCOUNTER — Other Ambulatory Visit (HOSPITAL_COMMUNITY): Payer: Self-pay

## 2024-05-19 ENCOUNTER — Other Ambulatory Visit (HOSPITAL_COMMUNITY): Payer: Self-pay

## 2024-05-19 NOTE — Telephone Encounter (Signed)
 Pharmacy Patient Advocate Encounter  Received notification from [employer] that an appeal for Mounjaro  10mg /0.66ml has been APPROVED from 05/11/24 to 05/11/25   PA #/Case ID/Reference #: [id number]   Left a message at [facility] to notify of the approval.

## 2024-07-19 ENCOUNTER — Other Ambulatory Visit: Payer: Self-pay | Admitting: Medical-Surgical

## 2024-07-19 DIAGNOSIS — I10 Essential (primary) hypertension: Secondary | ICD-10-CM

## 2024-10-19 ENCOUNTER — Ambulatory Visit: Admitting: Medical-Surgical
